# Patient Record
Sex: Female | Born: 1965 | Race: White | Hispanic: No | Marital: Married | Smoking: Never smoker
Health system: Southern US, Community
[De-identification: ages and names within clinical notes are randomized; demographics above are authoritative.]

## PROBLEM LIST (undated history)

## (undated) DIAGNOSIS — I10 Essential (primary) hypertension: Secondary | ICD-10-CM

## (undated) DIAGNOSIS — I469 Cardiac arrest, cause unspecified: Secondary | ICD-10-CM

## (undated) DIAGNOSIS — I2510 Atherosclerotic heart disease of native coronary artery without angina pectoris: Secondary | ICD-10-CM

## (undated) HISTORY — PX: ABDOMINAL HYSTERECTOMY: SHX81

---

## 2005-05-23 ENCOUNTER — Emergency Department: Payer: Self-pay | Admitting: Emergency Medicine

## 2008-08-27 ENCOUNTER — Inpatient Hospital Stay: Payer: Self-pay | Admitting: Obstetrics and Gynecology

## 2008-08-30 ENCOUNTER — Ambulatory Visit: Payer: Self-pay | Admitting: Gynecologic Oncology

## 2008-09-02 ENCOUNTER — Ambulatory Visit: Payer: Self-pay | Admitting: Gynecologic Oncology

## 2008-09-06 ENCOUNTER — Inpatient Hospital Stay: Payer: Self-pay | Admitting: Obstetrics and Gynecology

## 2008-09-21 ENCOUNTER — Ambulatory Visit: Payer: Self-pay | Admitting: Gynecologic Oncology

## 2010-01-31 ENCOUNTER — Ambulatory Visit: Payer: Self-pay | Admitting: Obstetrics and Gynecology

## 2010-02-13 ENCOUNTER — Ambulatory Visit: Payer: Self-pay | Admitting: Obstetrics and Gynecology

## 2011-01-24 ENCOUNTER — Ambulatory Visit: Payer: Self-pay | Admitting: Obstetrics and Gynecology

## 2012-09-04 ENCOUNTER — Ambulatory Visit: Payer: Self-pay | Admitting: Family Medicine

## 2013-10-01 DIAGNOSIS — I10 Essential (primary) hypertension: Secondary | ICD-10-CM | POA: Insufficient documentation

## 2013-10-01 DIAGNOSIS — E785 Hyperlipidemia, unspecified: Secondary | ICD-10-CM | POA: Insufficient documentation

## 2014-01-21 DIAGNOSIS — I469 Cardiac arrest, cause unspecified: Secondary | ICD-10-CM

## 2014-01-21 HISTORY — DX: Cardiac arrest, cause unspecified: I46.9

## 2014-04-03 DIAGNOSIS — E042 Nontoxic multinodular goiter: Secondary | ICD-10-CM | POA: Insufficient documentation

## 2014-11-19 DIAGNOSIS — I469 Cardiac arrest, cause unspecified: Secondary | ICD-10-CM | POA: Insufficient documentation

## 2014-11-29 DIAGNOSIS — I219 Acute myocardial infarction, unspecified: Secondary | ICD-10-CM | POA: Insufficient documentation

## 2014-11-29 DIAGNOSIS — R479 Unspecified speech disturbances: Secondary | ICD-10-CM | POA: Insufficient documentation

## 2014-11-29 DIAGNOSIS — R7989 Other specified abnormal findings of blood chemistry: Secondary | ICD-10-CM | POA: Insufficient documentation

## 2014-11-29 DIAGNOSIS — D696 Thrombocytopenia, unspecified: Secondary | ICD-10-CM | POA: Insufficient documentation

## 2014-11-29 DIAGNOSIS — D649 Anemia, unspecified: Secondary | ICD-10-CM | POA: Insufficient documentation

## 2014-12-14 DIAGNOSIS — I2510 Atherosclerotic heart disease of native coronary artery without angina pectoris: Secondary | ICD-10-CM | POA: Insufficient documentation

## 2014-12-18 DIAGNOSIS — G931 Anoxic brain damage, not elsewhere classified: Secondary | ICD-10-CM | POA: Insufficient documentation

## 2014-12-28 ENCOUNTER — Ambulatory Visit: Payer: BC Managed Care – PPO | Admitting: Speech Pathology

## 2015-02-08 ENCOUNTER — Encounter: Payer: Self-pay | Admitting: *Deleted

## 2015-02-08 ENCOUNTER — Encounter: Payer: BC Managed Care – PPO | Attending: Internal Medicine | Admitting: *Deleted

## 2015-02-08 VITALS — Ht 64.5 in | Wt 163.9 lb

## 2015-02-08 DIAGNOSIS — I214 Non-ST elevation (NSTEMI) myocardial infarction: Secondary | ICD-10-CM | POA: Diagnosis present

## 2015-02-08 DIAGNOSIS — Z9861 Coronary angioplasty status: Secondary | ICD-10-CM | POA: Insufficient documentation

## 2015-02-08 NOTE — Patient Instructions (Signed)
Patient Instructions  Patient Details  Name: Sarah Bowen MRN: [id number] Date of Birth: 1965/06/28 Referring Provider:  Raynelle Jan, MD  Below are the personal goals you chose as well as exercise and nutrition goals. Our goal is to help you keep on track towards obtaining and maintaining your goals. We will be discussing your progress on these goals with you throughout the program.  Initial Exercise Prescription:     Initial Exercise Prescription - 02/08/15 1400    Date of Initial Exercise Prescription   Date 02/08/15   Treadmill   MPH 2   Grade 0   Minutes 15   Bike   Level 0.4   Watts 12   Minutes 10   Recumbant Bike   Level 3   RPM 40   Watts 25   Minutes 15   NuStep   Level 3   Watts 30   Minutes 15   Arm Ergometer   Level 1   Watts 8   Minutes 10   Arm/Foot Ergometer   Level 4   Watts 15   Minutes 10   Cybex   Level 3   RPM 50   Minutes 10   Recumbant Elliptical   Level 1   RPM 40   Watts 10   Minutes 10   Elliptical   Level 1   Speed 3   Minutes 1   REL-XR   Level 3   Watts 40   Minutes 15   T5 Nustep   Level 2   Watts 20   Minutes 15   Biostep-RELP   Level 3   Watts 25   Minutes 15   Prescription Details   Frequency (times per week) 3   Duration Progress to 30 minutes of continuous aerobic without signs/symptoms of physical distress   Intensity   THRR REST +  30   Ratings of Perceived Exertion 11-15   Progression Continue progressive overload as per policy without signs/symptoms or physical distress.   Resistance Training   Training Prescription Yes   Weight 2   Reps 10-15      Exercise Goals: Frequency: Be able to perform aerobic exercise three times per week working toward 3-5 days per week.  Intensity: Work with a perceived exertion of 11 (fairly light) - 15 (hard) as tolerated. Follow your new exercise prescription and watch for changes in prescription as you progress with the program. Changes will be reviewed with you  when they are made.  Duration: You should be able to do 30 minutes of continuous aerobic exercise in addition to a 5 minute warm-up and a 5 minute cool-down routine.  Nutrition Goals: Your personal nutrition goals will be established when you do your nutrition analysis with the dietician.  The following are nutrition guidelines to follow: Cholesterol < /day Sodium < /day Fiber: Women under 50 yrs - 25 grams per day  Personal Goals:     Personal Goals and Risk Factors at Admission - 02/08/15 1527    Personal Goals and Risk Factors on Admission   Increase Aerobic Exercise and Physical Activity Yes   Intervention While in program, learn and follow the exercise prescription taught. Start at a low level workload and increase workload after able to maintain previous level for 30 minutes. Increase time before increasing intensity.   Take Less Medication Yes   Intervention Learn your risk factors and begin the lifestyle modifications for risk factor control during your time in the program.   Understand  more about Heart/Pulmonary Disease. Yes   Intervention While in program utilize professionals for any questions, and attend the education sessions. Great websites to use are www.americanheart.org or www.lung.org for reliable information.   Hypertension Yes   Goal Participant will see blood pressure controlled within the values of 140/30mm/Hg or within value directed by their physician.   Intervention Provide nutrition & aerobic exercise along with prescribed medications to achieve BP 140/90 or less.   Lipids Yes   Goal Cholesterol controlled with medications as prescribed, with individualized exercise RX and with personalized nutrition plan. Value goals: LDL < 70mg , HDL > 40mg . Participant states understanding of desired cholesterol values and following prescriptions.   Intervention Provide nutrition & aerobic exercise along with prescribed medications to achieve LDL 70mg , HDL >40mg .    Stress Yes   Goal To meet with psychosocial counselor for stress and relaxation information and guidance. To state understanding of performing relaxation techniques and or identifying personal stressors.   Intervention Provide education on types of stress, identifiying stressors, and ways to cope with stress. Provide demonstration and active practice of relaxation techniques.   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  Due to her cardiac arrest, NSTEMI, and PCI patient has not been able return to work.  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  She is seeing Human resources officer.  Patient would like to return to work.     Intervention Other  Participate in Cardiac Rehab to regain strength, endurance, and overall physical and mental health.        Tobacco Use Initial Evaluation: History  Smoking status  . Never Smoker   Smokeless tobacco  . Never Used    Copy of goals given to participant.

## 2015-02-08 NOTE — Progress Notes (Signed)
Cardiac Individual Treatment Plan  Patient Details  Name: Sarah Bowen MRN: [id number] Date of Birth: 1965-12-09 Referring Provider:  Raynelle Jan, MD  Initial Encounter Date: Date: 02/08/15  Visit Diagnosis: NSTEMI (non-ST elevated myocardial infarction) (HCC)  S/P PTCA (percutaneous transluminal coronary angioplasty)  Patient's Home Medications on Admission:  Current outpatient prescriptions:  .  amLODipine (NORVASC) 5 MG tablet, Take 5 mg by mouth every morning., Disp: , Rfl:  .  aspirin EC 81 MG tablet, Take 81 mg by mouth every morning., Disp: , Rfl:  .  atorvastatin (LIPITOR) 80 MG tablet, Take 80 mg by mouth at bedtime., Disp: , Rfl:  .  lisinopril (PRINIVIL,ZESTRIL) 20 MG tablet, Take 20 mg by mouth daily., Disp: , Rfl:  .  magnesium oxide (MAG-OX) 400 MG tablet, Take 400 mg by mouth daily., Disp: , Rfl:  .  metoprolol succinate (TOPROL-XL) 25 MG 24 hr tablet, Take 25 mg by mouth daily., Disp: , Rfl:  .  nitroGLYCERIN (NITROSTAT) 0.4 MG SL tablet, Place 0.4 mg under the tongue every 5 (five) minutes x 3 doses as needed. For Chest Pain, Disp: , Rfl:  .  ticagrelor (BRILINTA) 90 MG TABS tablet, Take 90 mg by mouth every 12 (twelve) hours., Disp: , Rfl:  .  EPINEPHrine 0.3 mg/0.3 mL IJ SOAJ injection, Inject 0.3 mg into the muscle Once PRN., Disp: , Rfl:   Past Medical History: History reviewed. No pertinent past medical history.  Tobacco Use: History  Smoking status  . Never Smoker   Smokeless tobacco  . Never Used    Labs: Recent Review Flowsheet Data    There is no flowsheet data to display.       Exercise Target Goals: Date: 02/08/15  Exercise Program Goal: Individual exercise prescription set with THRR, safety & activity barriers. Participant demonstrates ability to understand and report RPE using BORG scale, to self-measure pulse accurately, and to acknowledge the importance of the exercise prescription.  Exercise Prescription Goal: Starting with  aerobic activity 30 plus minutes a day, 3 days per week for initial exercise prescription. Provide home exercise prescription and guidelines that participant acknowledges understanding prior to discharge.  Activity Barriers & Risk Stratification:     Activity Barriers & Risk Stratification - 02/08/15 1522    Activity Barriers & Risk Stratification   Activity Barriers None   Risk Stratification High      6 Minute Walk:     6 Minute Walk      02/08/15 1438       6 Minute Walk   Phase Initial     Distance 1135 feet     Walk Time 6 minutes     Resting HR 68 bpm     Resting BP 110/70 mmHg     Max Ex. HR 117 bpm     Max Ex. BP 120/64 mmHg     RPE 7     Symptoms No        Initial Exercise Prescription:     Initial Exercise Prescription - 02/08/15 1400    Date of Initial Exercise Prescription   Date 02/08/15   Treadmill   MPH 2   Grade 0   Minutes 15   Bike   Level 0.4   Watts 12   Minutes 10   Recumbant Bike   Level 3   RPM 40   Watts 25   Minutes 15   NuStep   Level 3   Watts 30   Minutes 15  Arm Ergometer   Level 1   Watts 8   Minutes 10   Arm/Foot Ergometer   Level 4   Watts 15   Minutes 10   Cybex   Level 3   RPM 50   Minutes 10   Recumbant Elliptical   Level 1   RPM 40   Watts 10   Minutes 10   Elliptical   Level 1   Speed 3   Minutes 1   REL-XR   Level 3   Watts 40   Minutes 15   T5 Nustep   Level 2   Watts 20   Minutes 15   Biostep-RELP   Level 3   Watts 25   Minutes 15   Prescription Details   Frequency (times per week) 3   Duration Progress to 30 minutes of continuous aerobic without signs/symptoms of physical distress   Intensity   THRR REST +  30   Ratings of Perceived Exertion 11-15   Progression Continue progressive overload as per policy without signs/symptoms or physical distress.   Resistance Training   Training Prescription Yes   Weight 2   Reps 10-15      Exercise Prescription Changes:   Discharge  Exercise Prescription (Final Exercise Prescription Changes):   Nutrition:  Target Goals: Understanding of nutrition guidelines, daily intake of sodium 1500mg , cholesterol 200mg , calories 30% from fat and 7% or less from saturated fats, daily to have 5 or more servings of fruits and vegetables.  Biometrics:     Pre Biometrics - 02/08/15 1438    Pre Biometrics   Height 5' 4.5" (1.638 m)   Weight 163 lb 14.4 oz (74.345 kg)   Waist Circumference 34 inches   Hip Circumference 40.5 inches   Waist to Hip Ratio 0.84 %   BMI (Calculated) 27.8       Nutrition Therapy Plan and Nutrition Goals:   Nutrition Discharge: Rate Your Plate Scores:   Nutrition Goals Re-Evaluation:   Psychosocial: Target Goals: Acknowledge presence or absence of depression, maximize coping skills, provide positive support system. Participant is able to verbalize types and ability to use techniques and skills needed for reducing stress and depression.  Initial Review & Psychosocial Screening:     Initial Psych Review & Screening - 02/08/15 1731    Family Dynamics   Comments Patient has a 62 year old daughter and a 12 year old son.  Her sister, Sarah Bowen, who works at [employer], is also supportive and is involved.  Patient scored a 10 on PHQ-9 due in part to problems associated with anoxic brain injury.  Pt is slow to respond and process information.  Pt. is undergoing speech therapy.     Screening Interventions   Interventions Encouraged to exercise;Program counselor consult      Quality of Life Scores:     Quality of Life - 02/08/15 1738    Quality of Life Scores   Health/Function Pre 13.96 %   Socioeconomic Pre 18.69 %   Psych/Spiritual Pre 28.29 %   Family Pre 25.5 %   GLOBAL Pre 19.72 %      PHQ-9:     Recent Review Flowsheet Data    Depression screen [facility] 2/9 02/08/2015   Decreased Interest 0   Down, Depressed, Hopeless 0   PHQ - 2 Score 0   Altered sleeping 0   Tired,  decreased energy 3   Change in appetite 3   Feeling bad or failure about yourself  0   Trouble concentrating 1   Moving slowly or fidgety/restless 3   Suicidal thoughts 0   PHQ-9 Score 10   Difficult doing work/chores Somewhat difficult      Psychosocial Evaluation and Intervention:   Psychosocial Re-Evaluation:   Vocational Rehabilitation: Provide vocational rehab assistance to qualifying candidates.   Vocational Rehab Evaluation & Intervention:     Vocational Rehab - 02/08/15 1523    Initial Vocational Rehab Evaluation & Intervention   Assessment shows need for Vocational Rehabilitation No      Education: Education Goals: Education classes will be provided on a weekly basis, covering required topics. Participant will state understanding/return demonstration of topics presented.  Learning Barriers/Preferences:     Learning Barriers/Preferences - 02/08/15 1523    Learning Barriers/Preferences   Learning Barriers None   Learning Preferences Written Material      Education Topics: General Nutrition Guidelines/Fats and Fiber: -Group instruction provided by verbal, written material, models and posters to present the general guidelines for heart healthy nutrition. Gives an explanation and review of dietary fats and fiber.   Controlling Sodium/Reading Food Labels: -Group verbal and written material supporting the discussion of sodium use in heart healthy nutrition. Review and explanation with models, verbal and written materials for utilization of the food label.   Exercise Physiology & Risk Factors: - Group verbal and written instruction with models to review the exercise physiology of the cardiovascular system and associated critical values. Details cardiovascular disease risk factors and the goals associated with each risk factor.   Aerobic Exercise & Resistance Training: - Gives group verbal and written discussion on the health impact of inactivity. On the  components of aerobic and resistive training programs and the benefits of this training and how to safely progress through these programs.   Flexibility, Balance, General Exercise Guidelines: - Provides group verbal and written instruction on the benefits of flexibility and balance training programs. Provides general exercise guidelines with specific guidelines to those with heart or lung disease. Demonstration and skill practice provided.   Stress Management: - Provides group verbal and written instruction about the health risks of elevated stress, cause of high stress, and healthy ways to reduce stress.   Depression: - Provides group verbal and written instruction on the correlation between heart/lung disease and depressed mood, treatment options, and the stigmas associated with seeking treatment.   Anatomy & Physiology of the Heart: - Group verbal and written instruction and models provide basic cardiac anatomy and physiology, with the coronary electrical and arterial systems. Review of: AMI, Angina, Valve disease, Heart Failure, Cardiac Arrhythmia, Pacemakers, and the ICD.   Cardiac Procedures: - Group verbal and written instruction and models to describe the testing methods done to diagnose heart disease. Reviews the outcomes of the test results. Describes the treatment choices: Medical Management, Angioplasty, or Coronary Bypass Surgery.   Cardiac Medications: - Group verbal and written instruction to review commonly prescribed medications for heart disease. Reviews the medication, class of the drug, and side effects. Includes the steps to properly store meds and maintain the prescription regimen.   Go Sex-Intimacy & Heart Disease, Get SMART - Goal Setting: - Group verbal and written instruction through game format to discuss heart disease and the return to sexual intimacy. Provides group verbal and written material to discuss and apply goal setting through the application of the  S.M.A.R.T. Method.   Other Matters of the Heart: - Provides group verbal, written materials and models to describe Heart Failure, Angina, Valve Disease,  and Diabetes in the realm of heart disease. Includes description of the disease process and treatment options available to the cardiac patient.   Exercise & Equipment Safety: - Individual verbal instruction and demonstration of equipment use and safety with use of the equipment.          Cardiac Rehab from 02/08/2015 in [facility] Cardiac and Pulmonary Rehab   Date  02/08/15   Educator  D. Delford Field, RN   Instruction Review Code  1- partially meets, needs review/practice      Infection Prevention: - Provides verbal and written material to individual with discussion of infection control including proper hand washing and proper equipment cleaning during exercise session.      Cardiac Rehab from 02/08/2015 in [facility] Cardiac and Pulmonary Rehab   Date  02/08/15   Educator  D. Delford Field, RN   Instruction Review Code  2- meets goals/outcomes      Falls Prevention: - Provides verbal and written material to individual with discussion of falls prevention and safety.      Cardiac Rehab from 02/08/2015 in [facility] Cardiac and Pulmonary Rehab   Date  02/08/15   Educator  D. Delford Field, RN   Instruction Review Code  2- meets goals/outcomes      Diabetes: - Individual verbal and written instruction to review signs/symptoms of diabetes, desired ranges of glucose level fasting, after meals and with exercise. Advice that pre and post exercise glucose checks will be done for 3 sessions at entry of program.    Knowledge Questionnaire Score:     Knowledge Questionnaire Score - 02/08/15 1523    Knowledge Questionnaire Score   Pre Score 22/28      Personal Goals and Risk Factors at Admission:     Personal Goals and Risk Factors at Admission - 02/08/15 1527    Personal Goals and Risk Factors on Admission   Increase Aerobic Exercise and Physical Activity Yes    Intervention While in program, learn and follow the exercise prescription taught. Start at a low level workload and increase workload after able to maintain previous level for 30 minutes. Increase time before increasing intensity.   Take Less Medication Yes   Intervention Learn your risk factors and begin the lifestyle modifications for risk factor control during your time in the program.   Understand more about Heart/Pulmonary Disease. Yes   Intervention While in program utilize professionals for any questions, and attend the education sessions. Great websites to use are www.americanheart.org or www.lung.org for reliable information.   Hypertension Yes   Goal Participant will see blood pressure controlled within the values of 140/24mm/Hg or within value directed by their physician.   Intervention Provide nutrition & aerobic exercise along with prescribed medications to achieve BP 140/90 or less.   Lipids Yes   Goal Cholesterol controlled with medications as prescribed, with individualized exercise RX and with personalized nutrition plan. Value goals: LDL < 70mg , HDL > 40mg . Participant states understanding of desired cholesterol values and following prescriptions.   Intervention Provide nutrition & aerobic exercise along with prescribed medications to achieve LDL 70mg , HDL >40mg .   Stress Yes   Goal To meet with psychosocial counselor for stress and relaxation information and guidance. To state understanding of performing relaxation techniques and or identifying personal stressors.   Intervention Provide education on types of stress, identifiying stressors, and ways to cope with stress. Provide demonstration and active practice of relaxation techniques.   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  Due to her cardiac arrest, NSTEMI, and PCI patient has not been able return to work.  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  She is seeing Human resources officer.  Patient would like to return to work.     Intervention Other  Participate in Cardiac Rehab to regain strength, endurance, and overall physical and mental health.        Personal Goals and Risk Factors Review:    Personal Goals Discharge (Final Personal Goals and Risk Factors Review):    ITP Comments:   Comments: Patient is s/p cardiac arrest (V-Fib Arrest), NSTEMI and PCI.  Patient wears a Technical sales engineer and will see the Electrophysiologist on February 8th, 2017.   Patient suffered anoxic brain injury as a result of cardiac arrest.  Her deficits include slow to process thoughts and slow to find the words to respond.  Her daughter, Sarah Bowen, and her sister, Sarah Bowen, accompanied her today.  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  Sarah Bowen will start Cardiac Rehab on Monday, January 23rd, 2017.

## 2015-02-13 DIAGNOSIS — I214 Non-ST elevation (NSTEMI) myocardial infarction: Secondary | ICD-10-CM

## 2015-02-13 DIAGNOSIS — Z9861 Coronary angioplasty status: Secondary | ICD-10-CM

## 2015-02-13 NOTE — Progress Notes (Signed)
Daily Session Note  Patient Details  Name: ELODY KLEINSASSER MRN: [id number] Date of Birth: October 02, 1965 Referring Provider:  Concha Pyo, MD  Encounter Date: 02/13/2015  Check In:     Session Check In - 02/13/15 1619    Check-In   Staff Present Heath Lark, RN, BSN, CCRP;Carroll Enterkin, RN, BSN;Antinette Keough, BS, ACSM EP-C, Exercise Physiologist   ER physicians immediately available to respond to emergencies See telemetry face sheet for immediately available ER MD   Medication changes reported     No   Fall or balance concerns reported    No   Warm-up and Cool-down Performed on first and last piece of equipment   VAD Patient? No   Pain Assessment   Currently in Pain? No/denies         Goals Met:  Proper associated with RPD/PD & O2 Sat Exercise tolerated well No report of cardiac concerns or symptoms Strength training completed today  Goals Unmet:  Not Applicable  Goals Comments:   Dr. Emily Filbert is Medical Director for [place] [facility].

## 2015-02-15 ENCOUNTER — Encounter: Payer: BC Managed Care – PPO | Admitting: *Deleted

## 2015-02-15 DIAGNOSIS — I214 Non-ST elevation (NSTEMI) myocardial infarction: Secondary | ICD-10-CM | POA: Diagnosis not present

## 2015-02-15 DIAGNOSIS — Z9861 Coronary angioplasty status: Secondary | ICD-10-CM

## 2015-02-15 NOTE — Progress Notes (Signed)
Daily Session Note  Patient Details  Name: Sarah Bowen MRN: [id number] Date of Birth: May 10, 1965 Referring Provider:  Concha Pyo, MD  Encounter Date: 02/15/2015  Check In:     Session Check In - 02/15/15 1604    Check-In   Staff Present Heath Lark, RN, BSN, CCRP;Diane Joya Gaskins, RN, BSN;Carroll Enterkin, RN, BSN   ER physicians immediately available to respond to emergencies See telemetry face sheet for immediately available ER MD   Medication changes reported     No   Fall or balance concerns reported    No   Warm-up and Cool-down Performed on first and last piece of equipment   VAD Patient? No   Pain Assessment   Currently in Pain? No/denies           Exercise Prescription Changes - 02/15/15 1600    NuStep   Level 3   Watts 30   Minutes 20      Goals Met:  Exercise tolerated well Personal goals reviewed No report of cardiac concerns or symptoms Strength training completed today  Goals Unmet:  Not Applicable  Goals Comments: Increased exercise prescription on NuStep to Level 3 Watts 30 for 20 minutes.  Patient completed exercise prescription and all exercise goals during rehab session. The exercise was tolerated well and the patient is progressing in the program.    Dr. Emily Filbert is Medical Director for [place] [facility].

## 2015-02-16 DIAGNOSIS — I214 Non-ST elevation (NSTEMI) myocardial infarction: Secondary | ICD-10-CM | POA: Diagnosis not present

## 2015-02-16 DIAGNOSIS — Z9861 Coronary angioplasty status: Secondary | ICD-10-CM

## 2015-02-16 NOTE — Progress Notes (Signed)
Cardiac Individual Treatment Plan  Patient Details  Name: Sarah Bowen MRN: [id number] Date of Birth: 1965/07/10 Referring Provider:  Concha Pyo, MD  Initial Encounter Date:    Visit Diagnosis: NSTEMI (non-ST elevated myocardial infarction) ([place])  S/P PTCA (percutaneous transluminal coronary angioplasty)  Patient's Home Medications on Admission:  Current outpatient prescriptions:  .  amLODipine (NORVASC) 5 MG tablet, Take 5 mg by mouth every morning., Disp: , Rfl:  .  aspirin EC 81 MG tablet, Take 81 mg by mouth every morning., Disp: , Rfl:  .  atorvastatin (LIPITOR) 80 MG tablet, Take 80 mg by mouth at bedtime., Disp: , Rfl:  .  EPINEPHrine 0.3 mg/0.3 mL IJ SOAJ injection, Inject 0.3 mg into the muscle Once PRN., Disp: , Rfl:  .  lisinopril (PRINIVIL,ZESTRIL) 20 MG tablet, Take 20 mg by mouth daily., Disp: , Rfl:  .  magnesium oxide (MAG-OX) 400 MG tablet, Take 400 mg by mouth daily., Disp: , Rfl:  .  metoprolol succinate (TOPROL-XL) 25 MG 24 hr tablet, Take 25 mg by mouth daily., Disp: , Rfl:  .  nitroGLYCERIN (NITROSTAT) 0.4 MG SL tablet, Place 0.4 mg under the tongue every 5 (five) minutes x 3 doses as needed. For Chest Pain, Disp: , Rfl:  .  ticagrelor (BRILINTA) 90 MG TABS tablet, Take 90 mg by mouth every 12 (twelve) hours., Disp: , Rfl:   Past Medical History: No past medical history on file.  Tobacco Use: History  Smoking status  . Never Smoker   Smokeless tobacco  . Never Used    Labs: Recent Review Flowsheet Data    There is no flowsheet data to display.       Exercise Target Goals:    Exercise Program Goal: Individual exercise prescription set with THRR, safety & activity barriers. Participant demonstrates ability to understand and report RPE using BORG scale, to self-measure pulse accurately, and to acknowledge the importance of the exercise prescription.  Exercise Prescription Goal: Starting with aerobic activity 30 plus minutes a day, 3 days per  week for initial exercise prescription. Provide home exercise prescription and guidelines that participant acknowledges understanding prior to discharge.  Activity Barriers & Risk Stratification:     Activity Barriers & Risk Stratification - 02/08/15 1522    Activity Barriers & Risk Stratification   Activity Barriers None   Risk Stratification High      6 Minute Walk:     6 Minute Walk      02/08/15 1438       6 Minute Walk   Phase Initial     Distance 1135 feet     Walk Time 6 minutes     Resting HR 68 bpm     Resting BP 110/70 mmHg     Max Ex. HR 117 bpm     Max Ex. BP 120/64 mmHg     RPE 7     Symptoms No        Initial Exercise Prescription:     Initial Exercise Prescription - 02/08/15 1400    Date of Initial Exercise Prescription   Date 02/08/15   Treadmill   MPH 2   Grade 0   Minutes 15   Bike   Level 0.4   Watts 12   Minutes 10   Recumbant Bike   Level 3   RPM 40   Watts 25   Minutes 15   NuStep   Level 3   Watts 30   Minutes 15  Arm Ergometer   Level 1   Watts 8   Minutes 10   Arm/Foot Ergometer   Level 4   Watts 15   Minutes 10   Cybex   Level 3   RPM 50   Minutes 10   Recumbant Elliptical   Level 1   RPM 40   Watts 10   Minutes 10   Elliptical   Level 1   Speed 3   Minutes 1   REL-XR   Level 3   Watts 40   Minutes 15   T5 Nustep   Level 2   Watts 20   Minutes 15   Biostep-RELP   Level 3   Watts 25   Minutes 15   Prescription Details   Frequency (times per week) 3   Duration Progress to 30 minutes of continuous aerobic without signs/symptoms of physical distress   Intensity   THRR REST +  30   Ratings of Perceived Exertion 11-15   Progression Continue progressive overload as per policy without signs/symptoms or physical distress.   Resistance Training   Training Prescription Yes   Weight 2   Reps 10-15      Exercise Prescription Changes:     Exercise Prescription Changes      02/15/15 1600            NuStep   Level 3       Watts 30       Minutes 20          Discharge Exercise Prescription (Final Exercise Prescription Changes):     Exercise Prescription Changes - 02/15/15 1600    NuStep   Level 3   Watts 30   Minutes 20      Nutrition:  Target Goals: Understanding of nutrition guidelines, daily intake of sodium '1500mg'$ , cholesterol '200mg'$ , calories 30% from fat and 7% or less from saturated fats, daily to have 5 or more servings of fruits and vegetables.  Biometrics:     Pre Biometrics - 02/08/15 1438    Pre Biometrics   Height 5' 4.5" (1.638 m)   Weight 163 lb 14.4 oz (74.345 kg)   Waist Circumference 34 inches   Hip Circumference 40.5 inches   Waist to Hip Ratio 0.84 %   BMI (Calculated) 27.8       Nutrition Therapy Plan and Nutrition Goals:   Nutrition Discharge: Rate Your Plate Scores:   Nutrition Goals Re-Evaluation:   Psychosocial: Target Goals: Acknowledge presence or absence of depression, maximize coping skills, provide positive support system. Participant is able to verbalize types and ability to use techniques and skills needed for reducing stress and depression.  Initial Review & Psychosocial Screening:     Initial Psych Review & Screening - 02/08/15 1731    Family Dynamics   Comments Patient has a 42 year old daughter and a 22 year old son.  Her sister, Dereck Ligas, who works at [employer], is also supportive and is involved.  Patient scored a 10 on PHQ-9 due in part to problems associated with anoxic brain injury.  Pt is slow to respond and process information.  Pt. is undergoing speech therapy.     Screening Interventions   Interventions Encouraged to exercise;Program counselor consult      Quality of Life Scores:     Quality of Life - 02/08/15 1738    Quality of Life Scores   Health/Function Pre 13.96 %   Socioeconomic Pre 18.69 %   Psych/Spiritual Pre  28.29 %   Family Pre 25.5 %   GLOBAL Pre 19.72 %      PHQ-9:      Recent Review Flowsheet Data    Depression screen [facility] 2/9 02/08/2015   Decreased Interest 0   Down, Depressed, Hopeless 0   PHQ - 2 Score 0   Altered sleeping 0   Tired, decreased energy 3   Change in appetite 3   Feeling bad or failure about yourself  0   Trouble concentrating 1   Moving slowly or fidgety/restless 3   Suicidal thoughts 0   PHQ-9 Score 10   Difficult doing work/chores Somewhat difficult      Psychosocial Evaluation and Intervention:     Psychosocial Evaluation - 02/13/15 1739    Psychosocial Evaluation & Interventions   Interventions Encouraged to exercise with the program and follow exercise prescription   Comments Counselor met with Ms. Armbrister today for initial psychosocial evaluation.  She is a 50 year old who went into cardiac arrest in October and was out of oxygen for quite some time, causing some cognitive problems currently.  She has a strong support system with older children living in her home and a sister close by.  She also is actively involved in her local church community.  Ms. Neiswonger states she sleeps well and although a "picky eater", her appetite is generally okay.  She denies a history of depression or anxiety or current symptoms.  She also states her mood is typically positive.  Ms. 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  She also is unable to drive currently.  Her goals for this program are to increase her stamina and strength to be able to go up and down her stairs in her two story home with greater ease.  She plans to look into local resources, or a gym for maintenance upon completion of this program.        Psychosocial Re-Evaluation:   Vocational Rehabilitation: Provide vocational rehab assistance to qualifying candidates.   Vocational Rehab Evaluation & Intervention:     Vocational Rehab - 02/08/15 1523     Initial Vocational Rehab Evaluation & Intervention   Assessment shows need for Vocational Rehabilitation No      Education: Education Goals: Education classes will be provided on a weekly basis, covering required topics. Participant will state understanding/return demonstration of topics presented.  Learning Barriers/Preferences:     Learning Barriers/Preferences - 02/08/15 1523    Learning Barriers/Preferences   Learning Barriers None   Learning Preferences Written Material      Education Topics: General Nutrition Guidelines/Fats and Fiber: -Group instruction provided by verbal, written material, models and posters to present the general guidelines for heart healthy nutrition. Gives an explanation and review of dietary fats and fiber.   Controlling Sodium/Reading Food Labels: -Group verbal and written material supporting the discussion of sodium use in heart healthy nutrition. Review and explanation with models, verbal and written materials for utilization of the food label.   Exercise Physiology & Risk Factors: - Group verbal and written instruction with models to review the exercise physiology of the cardiovascular system and associated critical values. Details cardiovascular disease risk factors and the goals associated with each risk factor.   Aerobic Exercise & Resistance Training: - Gives group verbal and written discussion on the health impact of inactivity. On the components of  aerobic and resistive training programs and the benefits of this training and how to safely progress through these programs.   Flexibility, Balance, General Exercise Guidelines: - Provides group verbal and written instruction on the benefits of flexibility and balance training programs. Provides general exercise guidelines with specific guidelines to those with heart or lung disease. Demonstration and skill practice provided.   Stress Management: - Provides group verbal and written instruction  about the health risks of elevated stress, cause of high stress, and healthy ways to reduce stress.   Depression: - Provides group verbal and written instruction on the correlation between heart/lung disease and depressed mood, treatment options, and the stigmas associated with seeking treatment.   Anatomy & Physiology of the Heart: - Group verbal and written instruction and models provide basic cardiac anatomy and physiology, with the coronary electrical and arterial systems. Review of: AMI, Angina, Valve disease, Heart Failure, Cardiac Arrhythmia, Pacemakers, and the ICD.          Cardiac Rehab from 02/15/2015 in [facility] Cardiac and Pulmonary Rehab   Date  02/13/15   Educator  SB   Instruction Review Code  2- meets goals/outcomes      Cardiac Procedures: - Group verbal and written instruction and models to describe the testing methods done to diagnose heart disease. Reviews the outcomes of the test results. Describes the treatment choices: Medical Management, Angioplasty, or Coronary Bypass Surgery.   Cardiac Medications: - Group verbal and written instruction to review commonly prescribed medications for heart disease. Reviews the medication, class of the drug, and side effects. Includes the steps to properly store meds and maintain the prescription regimen.   Go Sex-Intimacy & Heart Disease, Get SMART - Goal Setting: - Group verbal and written instruction through game format to discuss heart disease and the return to sexual intimacy. Provides group verbal and written material to discuss and apply goal setting through the application of the S.M.A.R.T. Method.   Other Matters of the Heart: - Provides group verbal, written materials and models to describe Heart Failure, Angina, Valve Disease, and Diabetes in the realm of heart disease. Includes description of the disease process and treatment options available to the cardiac patient.   Exercise & Equipment Safety: - Individual verbal  instruction and demonstration of equipment use and safety with use of the equipment.      Cardiac Rehab from 02/15/2015 in [facility] Cardiac and Pulmonary Rehab   Date  02/08/15   Educator  D. Joya Gaskins, RN   Instruction Review Code  1- partially meets, needs review/practice      Infection Prevention: - Provides verbal and written material to individual with discussion of infection control including proper hand washing and proper equipment cleaning during exercise session.      Cardiac Rehab from 02/15/2015 in [facility] Cardiac and Pulmonary Rehab   Date  02/08/15   Educator  D. Joya Gaskins, RN   Instruction Review Code  2- meets goals/outcomes      Falls Prevention: - Provides verbal and written material to individual with discussion of falls prevention and safety.      Cardiac Rehab from 02/15/2015 in [facility] Cardiac and Pulmonary Rehab   Date  02/08/15   Educator  D. Joya Gaskins, RN   Instruction Review Code  2- meets goals/outcomes      Diabetes: - Individual verbal and written instruction to review signs/symptoms of diabetes, desired ranges of glucose level fasting, after meals and with exercise. Advice that pre and post exercise glucose checks will be  done for 3 sessions at entry of program.    Knowledge Questionnaire Score:     Knowledge Questionnaire Score - 02/08/15 1523    Knowledge Questionnaire Score   Pre Score 22/28      Personal Goals and Risk Factors at Admission:     Personal Goals and Risk Factors at Admission - 02/08/15 1527    Personal Goals and Risk Factors on Admission   Increase Aerobic Exercise and Physical Activity Yes   Intervention While in program, learn and follow the exercise prescription taught. Start at a low level workload and increase workload after able to maintain previous level for 30 minutes. Increase time before increasing intensity.   Take Less Medication Yes   Intervention Learn your risk factors and begin the lifestyle modifications for risk factor control  during your time in the program.   Understand more about Heart/Pulmonary Disease. Yes   Intervention While in program utilize professionals for any questions, and attend the education sessions. Great websites to use are www.americanheart.org or www.lung.org for reliable information.   Hypertension Yes   Goal Participant will see blood pressure controlled within the values of 140/37m/Hg or within value directed by their physician.   Intervention Provide nutrition & aerobic exercise along with prescribed medications to achieve BP 140/90 or less.   Lipids Yes   Goal Cholesterol controlled with medications as prescribed, with individualized exercise RX and with personalized nutrition plan. Value goals: LDL < '70mg'$ , HDL > '40mg'$ . Participant states understanding of desired cholesterol values and following prescriptions.   Intervention Provide nutrition & aerobic exercise along with prescribed medications to achieve LDL '70mg'$ , HDL >'40mg'$ .   Stress Yes   Goal To meet with psychosocial counselor for stress and relaxation information and guidance. To state understanding of performing relaxation techniques and or identifying personal stressors.   Intervention Provide education on types of stress, identifiying stressors, and ways to cope with stress. Provide demonstration and active practice of relaxation techniques.   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  Due to her cardiac arrest, NSTEMI, and PCI patient has not been able return to work.  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  She is seeing SAstronomer  Patient would like to return to work.     Intervention Other  Participate in Cardiac Rehab to regain strength, endurance, and overall physical and mental health.        Personal Goals and Risk Factors Review:      Goals and Risk Factor Review      02/13/15  1818           Increase Aerobic Exercise and Physical Activity   Comments Spoke with Tammy today to review the exercise progam process and how the staff will work with her to slowly increase her workloads to help her build up her stamina.       Hypertension   Goal Participant will see blood pressure controlled within the values of 140/913mHg or within value directed by their physician.       Comments Reviewed the risk factor and the steps to help keep and maintain control of the risk factor: Medication compliance, Nutrition and the exercise program.  Tammy stated understanding of th einformation provided today.       Abnormal Lipids   Goal Cholesterol controlled with medications as prescribed, with individualized exercise RX and with personalized nutrition  plan. Value goals: LDL < '70mg'$ , HDL > '40mg'$ . Participant states understanding of desired cholesterol values and following prescriptions.       Comments Reviewed the risk factor and the steps to help keep and maintain control of the risk factor: Medication compliance, Nutrition and the exercise program.  Tammy stated understanding of th einformation provided today.       Stress   Goal To meet with psychosocial counselor for stress and relaxation information and guidance. To state understanding of performing relaxation techniques and or identifying personal stressors.       Comments Met with psychosocial counselor today          Personal Goals Discharge (Final Personal Goals and Risk Factors Review):      Goals and Risk Factor Review - 02/13/15 1818    Increase Aerobic Exercise and Physical Activity   Comments Spoke with Tammy today to review the exercise progam process and how the staff will work with her to slowly increase her workloads to help her build up her stamina.   Hypertension   Goal Participant will see blood pressure controlled within the values of 140/4m/Hg or within value directed by their physician.   Comments Reviewed the  risk factor and the steps to help keep and maintain control of the risk factor: Medication compliance, Nutrition and the exercise program.  Tammy stated understanding of th einformation provided today.   Abnormal Lipids   Goal Cholesterol controlled with medications as prescribed, with individualized exercise RX and with personalized nutrition plan. Value goals: LDL < '70mg'$ , HDL > '40mg'$ . Participant states understanding of desired cholesterol values and following prescriptions.   Comments Reviewed the risk factor and the steps to help keep and maintain control of the risk factor: Medication compliance, Nutrition and the exercise program.  Tammy stated understanding of th einformation provided today.   Stress   Goal To meet with psychosocial counselor for stress and relaxation information and guidance. To state understanding of performing relaxation techniques and or identifying personal stressors.   Comments Met with psychosocial counselor today      ITP Comments:     ITP Comments      02/16/15 1323           ITP Comments Ready for 30 day review. Continue with ITP.  New to program has 2 visits completed since orietation          Comments:

## 2015-02-16 NOTE — Progress Notes (Signed)
Daily Session Note  Patient Details  Name: IVIONA HOLE MRN: [id number] Date of Birth: [birth year] Referring Provider:  Concha Pyo, MD  Encounter Date: 02/16/2015  Check In:     Session Check In - 02/16/15 1605    Check-In   Staff Present Gerlene Burdock, RN, BSN;Diane Joya Gaskins, RN, BSN;Siris Hoos, BS, ACSM EP-C, Exercise Physiologist   ER physicians immediately available to respond to emergencies See telemetry face sheet for immediately available ER MD   Medication changes reported     No   Fall or balance concerns reported    No   Warm-up and Cool-down Performed on first and last piece of equipment   VAD Patient? No   Pain Assessment   Currently in Pain? No/denies         Goals Met:  Proper associated with RPD/PD & O2 Sat Exercise tolerated well No report of cardiac concerns or symptoms Strength training completed today  Goals Unmet:  Not Applicable  Goals Comments:    Dr. Emily Filbert is Medical Director for [facility] and [facility].

## 2015-02-20 DIAGNOSIS — I214 Non-ST elevation (NSTEMI) myocardial infarction: Secondary | ICD-10-CM | POA: Diagnosis not present

## 2015-02-20 DIAGNOSIS — Z9861 Coronary angioplasty status: Secondary | ICD-10-CM

## 2015-02-20 NOTE — Progress Notes (Signed)
Daily Session Note  Patient Details  Name: Sarah Bowen MRN: [id number] Date of Birth: [birth year] Referring Provider:  Concha Pyo, MD  Encounter Date: 02/20/2015  Check In:     Session Check In - 02/20/15 1605    Check-In   Staff Present Heath Lark, RN, BSN, CCRP;Lena Gores, BS, ACSM EP-C, Exercise Physiologist;Other   ER physicians immediately available to respond to emergencies See telemetry face sheet for immediately available ER MD   Medication changes reported     No   Fall or balance concerns reported    No   Warm-up and Cool-down Performed on first and last piece of equipment   VAD Patient? No   Pain Assessment   Currently in Pain? No/denies         Goals Met:  Proper associated with RPD/PD & O2 Sat Exercise tolerated well No report of cardiac concerns or symptoms Strength training completed today  Goals Unmet:  Not Applicable  Goals Comments:   Dr. Emily Filbert is Medical Director for [place] [facility].

## 2015-02-22 ENCOUNTER — Encounter: Payer: BC Managed Care – PPO | Attending: Internal Medicine | Admitting: *Deleted

## 2015-02-22 DIAGNOSIS — Z9861 Coronary angioplasty status: Secondary | ICD-10-CM | POA: Diagnosis present

## 2015-02-22 DIAGNOSIS — I214 Non-ST elevation (NSTEMI) myocardial infarction: Secondary | ICD-10-CM | POA: Diagnosis not present

## 2015-02-22 NOTE — Progress Notes (Signed)
Daily Session Note  Patient Details  Name: Sarah Bowen MRN: [id number] Date of Birth: 03-08-1965 Referring Provider:  Concha Pyo, MD  Encounter Date: 02/22/2015  Check In:     Session Check In - 02/22/15 1656    Check-In   Staff Present Candiss Norse, MS, ACSM CEP, Exercise Physiologist;Susanne Bice, RN, BSN, CCRP;Carroll Enterkin, RN, BSN   ER physicians immediately available to respond to emergencies See telemetry face sheet for immediately available ER MD   Medication changes reported     No   Fall or balance concerns reported    No   Warm-up and Cool-down Performed on first and last piece of equipment   VAD Patient? No   Pain Assessment   Currently in Pain? No/denies   Multiple Pain Sites No           Exercise Prescription Changes - 02/22/15 1600    Exercise Review   Progression Yes   Response to Exercise   Symptoms None   Comments [name] can now exercise for the full time and exercise progression will now focus on increases in intensity   Duration Progress to 50 minutes of aerobic without signs/symptoms of physical distress   Intensity Rest + 30   Progression Continue progressive overload as per policy without signs/symptoms or physical distress.   Resistance Training   Training Prescription Yes   Weight 3   Reps 10-15   Treadmill   MPH 2   Grade 0   Minutes 15   NuStep   Level 3   Watts 30   Minutes 20   REL-XR   Level 6   Watts 70   Minutes 15   Biostep-RELP   Level 4   Watts 40   Minutes 25      Goals Met:  Independence with exercise equipment Exercise tolerated well Personal goals reviewed Strength training completed today  Goals Unmet:  Not Applicable  Goals Comments: Patient completed exercise prescription and all exercise goals during rehab session. The exercise was tolerated well and the patient is progressing in the program.    Dr. Emily Filbert is Medical Director for [facility] and [facility].

## 2015-02-22 NOTE — Addendum Note (Signed)
Addended by: Rudy Jew on: 02/22/2015 10:50 AM   Modules accepted: Orders

## 2015-02-27 ENCOUNTER — Encounter: Payer: BC Managed Care – PPO | Admitting: *Deleted

## 2015-02-27 DIAGNOSIS — I214 Non-ST elevation (NSTEMI) myocardial infarction: Secondary | ICD-10-CM

## 2015-02-27 DIAGNOSIS — Z9861 Coronary angioplasty status: Secondary | ICD-10-CM

## 2015-02-27 NOTE — Progress Notes (Signed)
Daily Session Note  Patient Details  Name: Sarah Bowen MRN: [id number] Date of Birth: 03/05/1965 Referring Provider:  Roe, Matthew, MD  Encounter Date: 02/27/2015  Check In:     Session Check In - 02/27/15 1614    Check-In   Location [facility]   Staff Present Rebecca Sickles, PT;Renee MacMillan, MS, ACSM CEP, Exercise Physiologist;Susanne Bice, RN, BSN, CCRP   Supervising physician immediately available to respond to emergencies See telemetry face sheet for immediately available ER MD   Medication changes reported     No   Fall or balance concerns reported    No   Warm-up and Cool-down Performed on first and last piece of equipment   Resistance Training Performed Yes   VAD Patient? No   Pain Assessment   Currently in Pain? No/denies   Multiple Pain Sites No           Exercise Prescription Changes - 02/27/15 1600    Response to Exercise   Symptoms None   Duration Progress to 50 minutes of aerobic without signs/symptoms of physical distress   Intensity Rest + 30   Progression Continue progressive overload as per policy without signs/symptoms or physical distress.   Resistance Training   Training Prescription Yes   Weight 3   Reps 10-15   Treadmill   MPH 2   Grade 0   Minutes 15   NuStep   Level 3   Watts 30   Minutes 20   REL-XR   Level 6   Watts 70   Minutes 15   Biostep-RELP   Level 4   Watts 40   Minutes 25      Goals Met:  Independence with exercise equipment Exercise tolerated well No report of cardiac concerns or symptoms Strength training completed today  Goals Unmet:  Not Applicable  Goals Comments: Patient completed exercise prescription and all exercise goals during rehab session. The exercise was tolerated well and the patient is progressing in the program.    Dr. Mark Miller is Medical Director for [facility] and [facility]. 

## 2015-03-02 ENCOUNTER — Encounter: Payer: BC Managed Care – PPO | Admitting: *Deleted

## 2015-03-02 DIAGNOSIS — I214 Non-ST elevation (NSTEMI) myocardial infarction: Secondary | ICD-10-CM

## 2015-03-02 DIAGNOSIS — Z9861 Coronary angioplasty status: Secondary | ICD-10-CM

## 2015-03-02 NOTE — Progress Notes (Signed)
Daily Session Note  Patient Details  Name: Sarah Bowen MRN: [id number] Date of Birth: Mar 13, 1965 Referring Provider:  Concha Pyo, MD  Encounter Date: 03/02/2015  Check In:     Session Check In - 03/02/15 1723    Check-In   Location [facility]   Staff Present Heath Lark, RN, BSN, CCRP;Diane Joya Gaskins, RN, BSN;Paelyn Smick, RN, BSN   Supervising physician immediately available to respond to emergencies See telemetry face sheet for immediately available ER MD   Medication changes reported     No   Fall or balance concerns reported    No   Warm-up and Cool-down Performed on first and last piece of equipment   Resistance Training Performed Yes   VAD Patient? No   Pain Assessment   Currently in Pain? No/denies         Goals Met:  Proper associated with RPD/PD & O2 Sat Exercise tolerated well  Goals Unmet:  Not Applicable  Goals Comments:    Dr. Emily Filbert is Medical Director for [place] and [facility].

## 2015-03-06 ENCOUNTER — Encounter: Payer: BC Managed Care – PPO | Admitting: *Deleted

## 2015-03-06 DIAGNOSIS — Z9861 Coronary angioplasty status: Secondary | ICD-10-CM

## 2015-03-06 DIAGNOSIS — I214 Non-ST elevation (NSTEMI) myocardial infarction: Secondary | ICD-10-CM

## 2015-03-06 NOTE — Progress Notes (Signed)
Daily Session Note  Patient Details  Name: MELLISA ARSHAD MRN: [id number] Date of Birth: 23-Jun-1965 Referring Provider:  Concha Pyo, MD  Encounter Date: 03/06/2015  Check In:     Session Check In - 03/06/15 1621    Check-In   Location [facility]   Staff Present Candiss Norse, MS, ACSM CEP, Exercise Physiologist;Rebecca Sickles, PT;Susanne Bice, RN, BSN, CCRP   Supervising physician immediately available to respond to emergencies See telemetry face sheet for immediately available ER MD   Medication changes reported     No   Fall or balance concerns reported    No   Warm-up and Cool-down Performed on first and last piece of equipment   Resistance Training Performed Yes   VAD Patient? No   Pain Assessment   Currently in Pain? No/denies         Goals Met:  Independence with exercise equipment Exercise tolerated well No report of cardiac concerns or symptoms Strength training completed today  Goals Unmet:  Not Applicable  Goals Comments: Patient completed exercise prescription and all exercise goals during rehab session. The exercise was tolerated well and the patient is progressing in the program.    Dr. Emily Filbert is Medical Director for [place] and [facility].

## 2015-03-08 ENCOUNTER — Encounter: Payer: BC Managed Care – PPO | Admitting: *Deleted

## 2015-03-08 DIAGNOSIS — I214 Non-ST elevation (NSTEMI) myocardial infarction: Secondary | ICD-10-CM

## 2015-03-08 DIAGNOSIS — Z9861 Coronary angioplasty status: Secondary | ICD-10-CM

## 2015-03-08 NOTE — Progress Notes (Signed)
Daily Session Note  Patient Details  Name: TIANA SIVERTSON MRN: [id number] Date of Birth: Aug 14, 1965 Referring Provider:  Concha Pyo, MD  Encounter Date: 03/08/2015  Check In:     Session Check In - 03/08/15 1647    Check-In   Location [facility]   Staff Present Jeanell Sparrow, PT;Diane Joya Gaskins, RN, Drusilla Kanner, MS, ACSM CEP, Exercise Physiologist   Supervising physician immediately available to respond to emergencies See telemetry face sheet for immediately available ER MD   Medication changes reported     No   Fall or balance concerns reported    No   Warm-up and Cool-down Performed on first and last piece of equipment   Resistance Training Performed Yes   VAD Patient? No   Pain Assessment   Currently in Pain? No/denies         Goals Met:  Independence with exercise equipment Exercise tolerated well No report of cardiac concerns or symptoms Strength training completed today  Goals Unmet:  Not Applicable  Goals Comments: Patient completed exercise prescription and all exercise goals during rehab session. The exercise was tolerated well and the patient is progressing in the program.    Dr. Emily Filbert is Medical Director for [place] and [facility].

## 2015-03-09 ENCOUNTER — Encounter: Payer: BC Managed Care – PPO | Admitting: *Deleted

## 2015-03-09 ENCOUNTER — Encounter: Payer: Self-pay | Admitting: Dietician

## 2015-03-09 DIAGNOSIS — I214 Non-ST elevation (NSTEMI) myocardial infarction: Secondary | ICD-10-CM | POA: Diagnosis not present

## 2015-03-09 DIAGNOSIS — Z9861 Coronary angioplasty status: Secondary | ICD-10-CM

## 2015-03-09 NOTE — Progress Notes (Signed)
Daily Session Note  Patient Details  Name: JONALYN SEDLAK MRN: [id number] Date of Birth: [birth year] Referring Provider:  Concha Pyo, MD  Encounter Date: 03/09/2015  Check In:     Session Check In - 03/09/15 1620    Check-In   Location [facility]   Staff Present Gerlene Burdock, RN, Jana Half, RN, BSN;Susanne Bice, RN, BSN, CCRP   Supervising physician immediately available to respond to emergencies See telemetry face sheet for immediately available ER MD   Medication changes reported     No   Fall or balance concerns reported    No   Warm-up and Cool-down Performed on first and last piece of equipment   Resistance Training Performed No   VAD Patient? No   Pain Assessment   Currently in Pain? No/denies         Goals Met:  Proper associated with RPD/PD & O2 Sat Exercise tolerated well  Goals Unmet:  Not Applicable  Goals Comments:    Dr. Emily Filbert is Medical Director for [place] and [facility].

## 2015-03-09 NOTE — Progress Notes (Signed)
Cardiac Individual Treatment Plan  Patient Details  Name: Sarah Bowen MRN: [id number] Date of Birth: 02/01/1965 Referring Provider:  Concha Pyo, MD  Initial Encounter Date:    Visit Diagnosis: NSTEMI (non-ST elevated myocardial infarction) ([place])  S/P PTCA (percutaneous transluminal coronary angioplasty)  Patient's Home Medications on Admission:  Current outpatient prescriptions:  .  amLODipine (NORVASC) 5 MG tablet, Take 5 mg by mouth every morning., Disp: , Rfl:  .  aspirin EC 81 MG tablet, Take 81 mg by mouth every morning., Disp: , Rfl:  .  atorvastatin (LIPITOR) 80 MG tablet, Take 80 mg by mouth at bedtime., Disp: , Rfl:  .  EPINEPHrine 0.3 mg/0.3 mL IJ SOAJ injection, Inject 0.3 mg into the muscle Once PRN., Disp: , Rfl:  .  lisinopril (PRINIVIL,ZESTRIL) 20 MG tablet, Take 20 mg by mouth daily., Disp: , Rfl:  .  magnesium oxide (MAG-OX) 400 MG tablet, Take 400 mg by mouth daily., Disp: , Rfl:  .  metoprolol succinate (TOPROL-XL) 25 MG 24 hr tablet, Take 25 mg by mouth daily., Disp: , Rfl:  .  nitroGLYCERIN (NITROSTAT) 0.4 MG SL tablet, Place 0.4 mg under the tongue every 5 (five) minutes x 3 doses as needed. For Chest Pain, Disp: , Rfl:  .  ticagrelor (BRILINTA) 90 MG TABS tablet, Take 90 mg by mouth every 12 (twelve) hours., Disp: , Rfl:   Past Medical History: No past medical history on file.  Tobacco Use: History  Smoking status  . Never Smoker   Smokeless tobacco  . Never Used    Labs: Recent Review Flowsheet Data    There is no flowsheet data to display.       Exercise Target Goals:    Exercise Program Goal: Individual exercise prescription set with THRR, safety & activity barriers. Participant demonstrates ability to understand and report RPE using BORG scale, to self-measure pulse accurately, and to acknowledge the importance of the exercise prescription.  Exercise Prescription Goal: Starting with aerobic activity 30 plus minutes a day, 3 days per  week for initial exercise prescription. Provide home exercise prescription and guidelines that participant acknowledges understanding prior to discharge.  Activity Barriers & Risk Stratification:     Activity Barriers & Cardiac Risk Stratification - 02/08/15 1522    Activity Barriers & Cardiac Risk Stratification   Activity Barriers None   Cardiac Risk Stratification High      6 Minute Walk:     6 Minute Walk      02/08/15 1438       6 Minute Walk   Phase Initial     Distance 1135 feet     Walk Time 6 minutes     RPE 7     Symptoms No     Resting HR 68 bpm     Resting BP 110/70 mmHg     Max Ex. HR 117 bpm     Max Ex. BP 120/64 mmHg        Initial Exercise Prescription:     Initial Exercise Prescription - 02/08/15 1400    Date of Initial Exercise Prescription   Date 02/08/15   Treadmill   MPH 2   Grade 0   Minutes 15   Bike   Level 0.4   Watts 12   Minutes 10   Recumbant Bike   Level 3   RPM 40   Watts 25   Minutes 15   NuStep   Level 3   Watts 30   Minutes  15   Arm Ergometer   Level 1   Watts 8   Minutes 10   Arm/Foot Ergometer   Level 4   Watts 15   Minutes 10   Cybex   Level 3   RPM 50   Minutes 10   Recumbant Elliptical   Level 1   RPM 40   Watts 10   Minutes 10   Elliptical   Level 1   Speed 3   Minutes 1   REL-XR   Level 3   Watts 40   Minutes 15   T5 Nustep   Level 2   Watts 20   Minutes 15   Biostep-RELP   Level 3   Watts 25   Minutes 15   Prescription Details   Frequency (times per week) 3   Duration Progress to 30 minutes of continuous aerobic without signs/symptoms of physical distress   Intensity   THRR REST +  30   Ratings of Perceived Exertion 11-15   Progression Continue progressive overload as per policy without signs/symptoms or physical distress.   Resistance Training   Training Prescription Yes   Weight 2   Reps 10-15      Exercise Prescription Changes:     Exercise Prescription Changes       02/15/15 1600 02/16/15 1600 02/22/15 1600 02/27/15 1600 03/06/15 0743   Exercise Review   Progression   Yes  Yes   Response to Exercise   Blood Pressure (Admit)     124/76 mmHg   Blood Pressure (Exercise)     136/72 mmHg   Blood Pressure (Exit)     110/62 mmHg   Heart Rate (Admit)     66 bpm   Heart Rate (Exercise)     113 bpm   Heart Rate (Exit)     82 bpm   Rating of Perceived Exertion (Exercise)     12   Symptoms   None None None   Comments   Sarah Bowen can now exercise for the full time and exercise progression will now focus on increases in intensity  Sarah Bowen is steadily increasing in speed on the TM and in workload on the seated equipment. Once she has maxed out on her walking speed we will focus on increases in grade. We will introduce inteval training to Sarah Bowen and see if she wants to pursue this type of training, Interval training will make further progression possible for Sarah Bowen as she has maxed out on the aerobic exercise and can do continuous exercise for 45-50 minutes with no issue.    Duration   Progress to 50 minutes of aerobic without signs/symptoms of physical distress Progress to 50 minutes of aerobic without signs/symptoms of physical distress Progress to 50 minutes of aerobic without signs/symptoms of physical distress   Intensity   Rest + 30 Rest + 30 Rest + 30   Progression   Continue progressive overload as per policy without signs/symptoms or physical distress. Continue progressive overload as per policy without signs/symptoms or physical distress. Continue progressive overload as per policy without signs/symptoms or physical distress.   Resistance Training   Training Prescription   Yes Yes Yes   Weight   _0 Reps   10-15 10-15 10-15   Interval Training   Interval Training     Yes   Equipment     Treadmill;Recumbant Elliptical  REL= BioStep   Treadmill   MPH   _1 Grade  0 0 0   Minutes   _0 NuStep   Level _1 Watts _2 Minutes _3 REL-XR   Level   _4 Watts   70 70 70   Minutes   _5 Biostep-RELP   Level  _6 Watts  40 40 40 40   Minutes  _7 Discharge Exercise Prescription (Final Exercise Prescription Changes):     Exercise Prescription Changes - 03/06/15 0743    Exercise Review   Progression Yes   Response to Exercise   Blood Pressure (Admit) 124/76 mmHg   Blood Pressure (Exercise) 136/72 mmHg   Blood Pressure (Exit) 110/62 mmHg   Heart Rate (Admit) 66 bpm   Heart Rate (Exercise) 113 bpm   Heart Rate (Exit) 82 bpm   Rating of Perceived Exertion (Exercise) 12   Symptoms None   Comments Sarah Bowen is steadily increasing in speed on the TM and in workload on the seated equipment. Once she has maxed out on her walking speed we will focus on increases in grade. We will introduce inteval training to Sarah Bowen and see if she wants to pursue this type of training, Interval training will make further progression possible for Sarah Bowen as she has maxed out on the aerobic exercise and can do continuous exercise for 45-50 minutes with no issue.    Duration Progress to 50 minutes of aerobic without signs/symptoms of physical distress   Intensity Rest + 30   Progression Continue progressive overload as per policy without signs/symptoms or physical distress.   Resistance Training   Training Prescription Yes   Weight 3   Reps 10-15   Interval Training   Interval Training Yes   Equipment Treadmill;Recumbant Elliptical  REL= BioStep   Treadmill   MPH 2   Grade 0   Minutes 15   NuStep   Level 3   Watts 30   Minutes 20   REL-XR   Level 6   Watts 70   Minutes 15   Biostep-RELP   Level 4   Watts 40   Minutes 25      Nutrition:  Target Goals: Understanding of nutrition guidelines, daily intake of sodium <1533m, cholesterol <2038m calories 30% from fat and 7% or less from saturated fats, daily to have 5 or more servings of fruits and vegetables.  Biometrics:     Pre  Biometrics - 02/08/15 1438    Pre Biometrics   Height 5' 4.5" (1.638 m)   Weight 163 lb 14.4 oz (74.345 kg)   Waist Circumference 34 inches   Hip Circumference 40.5 inches   Waist to Hip Ratio 0.84 %   BMI (Calculated) 27.8       Nutrition Therapy Plan and Nutrition Goals:     Nutrition Therapy & Goals - 03/09/15 0935    Nutrition Therapy   Diet basic heart healthy diet, 1400kcal, working towards DASH guidelines   Drug/Food Interactions Statins/Certain Fruits   Fiber 25 grams   Whole Grain Foods 3 servings   Protein 6.5 ounces/day   Saturated Fats 11 max. grams   Fruits and Vegetables 5 servings/day   Personal Nutrition Goals   Personal Goal #1 Increase water intake; gradually add 4-8oz per day    Personal Goal #2 Add some servings of vegetables  and fruits -- plan to include at least one with each meal and/or snack; or monitor your daily servings   Comments Patient reports she is a picky eater, and diet changes are a challenge. Encouraged gradual, manageable changes, and 1-3 changes at a time. She will start with goals listed above.      Nutrition Discharge: Rate Your Plate Scores:     Rate Your Plate - 36/64/40 3474    Rate Your Plate Scores   Pre Score 57   Pre Score % 63 %      Nutrition Goals Re-Evaluation:     Nutrition Goals Re-Evaluation      03/09/15 1622           Personal Goal #1 Re-Evaluation   Goal Progress Seen Yes       Comments Sarah Bowen admitted today "i eat terrible but the dietician was encouraging to get me to eat healthier".           Psychosocial: Target Goals: Acknowledge presence or absence of depression, maximize coping skills, provide positive support system. Participant is able to verbalize types and ability to use techniques and skills needed for reducing stress and depression.  Initial Review & Psychosocial Screening:     Initial Psych Review & Screening - 02/08/15 1731    Family Dynamics   Comments Patient has a 62 year old  daughter and a 40 year old son.  Her sister, Sarah Bowen, who works at [employer], is also supportive and is involved.  Patient scored a 10 on PHQ-9 due in part to problems associated with anoxic brain injury.  Pt is slow to respond and process information.  Pt. is undergoing speech therapy.     Screening Interventions   Interventions Encouraged to exercise;Program counselor consult      Quality of Life Scores:     Quality of Life - 02/08/15 1738    Quality of Life Scores   Health/Function Pre 13.96 %   Socioeconomic Pre 18.69 %   Psych/Spiritual Pre 28.29 %   Family Pre 25.5 %   GLOBAL Pre 19.72 %      PHQ-9:     Recent Review Flowsheet Data    Depression screen [facility] 2/9 02/08/2015   Decreased Interest 0   Down, Depressed, Hopeless 0   PHQ - 2 Score 0   Altered sleeping 0   Tired, decreased energy 3   Change in appetite 3   Feeling bad or failure about yourself  0   Trouble concentrating 1   Moving slowly or fidgety/restless 3   Suicidal thoughts 0   PHQ-9 Score 10   Difficult doing work/chores Somewhat difficult      Psychosocial Evaluation and Intervention:     Psychosocial Evaluation - 02/13/15 1739    Psychosocial Evaluation & Interventions   Interventions Encouraged to exercise with the program and follow exercise prescription   Comments Counselor met with Sarah Bowen today for initial psychosocial evaluation.  She is a 50 year old who went into cardiac arrest in October and was out of oxygen for quite some time, causing some cognitive problems currently.  She has a strong support system with older children living in her home and a sister close by.  She also is actively involved in her local church community.  Sarah Bowen states she sleeps well and although a "picky eater", her appetite is generally okay.  She denies a history of depression or anxiety or current symptoms.  She also states her  mood is typically positive.  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  She also is unable to drive currently.  Her goals for this program are to increase her stamina and strength to be able to go up and down her stairs in her two story home with greater ease.  She plans to look into local resources, or a gym for maintenance upon completion of this program.        Psychosocial Re-Evaluation:     Psychosocial Re-Evaluation      03/08/15 1707 03/09/15 1624         Psychosocial Re-Evaluation   Comments Follow up with Sarah Bowen reporting a positive report from her cardiologist and hoping to be taken off the "life vest" soon.  She states she feels increased stamina since coming into this program and hopes that her neurological condition will also improve in time so that she can return to a more normal life with work and driving again.  She continues to maintain a positive attitude.  Counselor will continue to follow with her.   Sarah Bowen said she has a good support system and feels her stress is undercontrol.          Vocational Rehabilitation: Provide vocational rehab assistance to qualifying candidates.   Vocational Rehab Evaluation & Intervention:     Vocational Rehab - 02/08/15 1523    Initial Vocational Rehab Evaluation & Intervention   Assessment shows need for Vocational Rehabilitation No      Education: Education Goals: Education classes will be provided on a weekly basis, covering required topics. Participant will state understanding/return demonstration of topics presented.  Learning Barriers/Preferences:     Learning Barriers/Preferences - 02/08/15 1523    Learning Barriers/Preferences   Learning Barriers None   Learning Preferences Written Material      Education Topics: General Nutrition Guidelines/Fats and Fiber: -Group instruction provided by verbal, written material, models and posters to present the  general guidelines for heart healthy nutrition. Gives an explanation and review of dietary fats and fiber.          Cardiac Rehab from 03/06/2015 in [facility] Cardiac and Pulmonary Rehab   Date  02/27/15   Educator  PI   Instruction Review Code  2- meets goals/outcomes      Controlling Sodium/Reading Food Labels: -Group verbal and written material supporting the discussion of sodium use in heart healthy nutrition. Review and explanation with models, verbal and written materials for utilization of the food label.      Cardiac Rehab from 03/06/2015 in [facility] Cardiac and Pulmonary Rehab   Date  03/06/15   Educator  PI   Instruction Review Code  2- meets goals/outcomes      Exercise Physiology & Risk Factors: - Group verbal and written instruction with models to review the exercise physiology of the cardiovascular system and associated critical values. Details cardiovascular disease risk factors and the goals associated with each risk factor.   Aerobic Exercise & Resistance Training: - Gives group verbal and written discussion on the health impact of inactivity. On the components of aerobic and resistive training programs and the benefits of this training and how to safely progress through these programs.   Flexibility, Balance, General Exercise Guidelines: - Provides group verbal and written instruction on the benefits of flexibility and balance training programs. Provides general exercise guidelines with specific  guidelines to those with heart or lung disease. Demonstration and skill practice provided.   Stress Management: - Provides group verbal and written instruction about the health risks of elevated stress, cause of high stress, and healthy ways to reduce stress.   Depression: - Provides group verbal and written instruction on the correlation between heart/lung disease and depressed mood, treatment options, and the stigmas associated with seeking treatment.   Anatomy & Physiology of  the Heart: - Group verbal and written instruction and models provide basic cardiac anatomy and physiology, with the coronary electrical and arterial systems. Review of: AMI, Angina, Valve disease, Heart Failure, Cardiac Arrhythmia, Pacemakers, and the ICD.      Cardiac Rehab from 03/06/2015 in [facility] Cardiac and Pulmonary Rehab   Date  02/13/15   Educator  SB   Instruction Review Code  2- meets goals/outcomes      Cardiac Procedures: - Group verbal and written instruction and models to describe the testing methods done to diagnose heart disease. Reviews the outcomes of the test results. Describes the treatment choices: Medical Management, Angioplasty, or Coronary Bypass Surgery.   Cardiac Medications: - Group verbal and written instruction to review commonly prescribed medications for heart disease. Reviews the medication, class of the drug, and side effects. Includes the steps to properly store meds and maintain the prescription regimen.   Go Sex-Intimacy & Heart Disease, Get SMART - Goal Setting: - Group verbal and written instruction through game format to discuss heart disease and the return to sexual intimacy. Provides group verbal and written material to discuss and apply goal setting through the application of the S.M.A.R.T. Method.   Other Matters of the Heart: - Provides group verbal, written materials and models to describe Heart Failure, Angina, Valve Disease, and Diabetes in the realm of heart disease. Includes description of the disease process and treatment options available to the cardiac patient.      Cardiac Rehab from 03/06/2015 in [facility] Cardiac and Pulmonary Rehab   Date  02/20/15   Educator  SB   Instruction Review Code  2- meets goals/outcomes      Exercise & Equipment Safety: - Individual verbal instruction and demonstration of equipment use and safety with use of the equipment.      Cardiac Rehab from 03/06/2015 in [facility] Cardiac and Pulmonary Rehab   Date  02/08/15    Educator  D. Joya Gaskins, RN   Instruction Review Code  1- partially meets, needs review/practice      Infection Prevention: - Provides verbal and written material to individual with discussion of infection control including proper hand washing and proper equipment cleaning during exercise session.      Cardiac Rehab from 03/06/2015 in [facility] Cardiac and Pulmonary Rehab   Date  02/08/15   Educator  D. Joya Gaskins, RN   Instruction Review Code  2- meets goals/outcomes      Falls Prevention: - Provides verbal and written material to individual with discussion of falls prevention and safety.      Cardiac Rehab from 03/06/2015 in [facility] Cardiac and Pulmonary Rehab   Date  02/08/15   Educator  D. Joya Gaskins, RN   Instruction Review Code  2- meets goals/outcomes      Diabetes: - Individual verbal and written instruction to review signs/symptoms of diabetes, desired ranges of glucose level fasting, after meals and with exercise. Advice that pre and post exercise glucose checks will be done for 3 sessions at entry of program.    Knowledge Questionnaire Score:  Knowledge Questionnaire Score - 02/08/15 1523    Knowledge Questionnaire Score   Pre Score 22/28      Personal Goals and Risk Factors at Admission:     Personal Goals and Risk Factors at Admission - 02/08/15 1527    Personal Goals and Risk Factors on Admission   Increase Aerobic Exercise and Physical Activity Yes   Intervention While in program, learn and follow the exercise prescription taught. Start at a low level workload and increase workload after able to maintain previous level for 30 minutes. Increase time before increasing intensity.   Take Less Medication Yes   Intervention Learn your risk factors and begin the lifestyle modifications for risk factor control during your time in the program.   Understand more about Heart/Pulmonary Disease. Yes   Intervention While in program utilize professionals for any questions, and attend the  education sessions. Great websites to use are www.americanheart.org or www.lung.org for reliable information.   Hypertension Yes   Goal Participant will see blood pressure controlled within the values of 140/14m/Hg or within value directed by their physician.   Intervention Provide nutrition & aerobic exercise along with prescribed medications to achieve BP 140/90 or less.   Lipids Yes   Goal Cholesterol controlled with medications as prescribed, with individualized exercise RX and with personalized nutrition plan. Value goals: LDL < 757m HDL > 4077mParticipant states understanding of desired cholesterol values and following prescriptions.   Intervention Provide nutrition & aerobic exercise along with prescribed medications to achieve LDL <59m63mDL >40mg75mStress Yes   Goal To meet with psychosocial counselor for stress and relaxation information and guidance. To state understanding of performing relaxation techniques and or identifying personal stressors.   Intervention Provide education on types of stress, identifiying stressors, and ways to cope with stress. Provide demonstration and active practice of relaxation techniques.   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  Due to her cardiac arrest, NSTEMI, and PCI patient has not been able return to work.  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  She is seeing [employer] would like to return to work.     Intervention Other  Participate in Cardiac Rehab to regain strength, endurance, and overall physical and mental health.        Personal Goals and Risk Factors Review:      Goals and Risk Factor Review      02/13/15 1818 03/09/15 1622         Increase Aerobic Exercise and Physical Activity   Comments Spoke with Tammy today to review the exercise progam process and how the staff will  work with her to slowly increase her workloads to help her build up her stamina.       Hypertension   Goal Participant will see blood pressure controlled within the values of 140/90mm/80mr within value directed by their physician.       Progress seen toward goals  Yes      Comments Reviewed the risk factor and the steps to help keep and maintain control of the risk factor: Medication compliance, Nutrition and the exercise program.  Tammy stated understanding of th einformation provided today. Lillan's blood pressure is 130/-1016/-553lic /70   /74Abnormal Lipids   Goal Cholesterol controlled with medications as prescribed, with individualized exercise RX and with personalized nutrition plan.  Value goals: LDL < 42m, HDL > 441m Participant states understanding of desired cholesterol values and following prescriptions.       Progress seen towards goals  Unknown      Comments Reviewed the risk factor and the steps to help keep and maintain control of the risk factor: Medication compliance, Nutrition and the exercise program.  Tammy stated understanding of th einformation provided today.       Stress   Goal To meet with psychosocial counselor for stress and relaxation information and guidance. To state understanding of performing relaxation techniques and or identifying personal stressors.       Progress seen towards goals  Yes      Comments Met with psychosocial counselor today TaHollaceaid she has a good support system and feels her stress is undercontrol.          Personal Goals Discharge (Final Personal Goals and Risk Factors Review):      Goals and Risk Factor Review - 03/09/15 1622    Hypertension   Progress seen toward goals Yes   Comments Aldina's blood pressure is 13854/-627ystolic /7/03 Abnormal Lipids   Progress seen towards goals Unknown   Stress   Progress seen towards goals Yes   Comments TaImanaid she has a good support system and feels her stress is undercontrol.        ITP Comments:     ITP Comments      02/16/15 1323 03/02/15 1724         ITP Comments Ready for 30 day review. Continue with ITP.  New to program has 2 visits completed since orietation TaMyeshiaaid she is working with a Neuropsychologist who is having her do puzzles to increase her memory and function of her mind.          Comments:

## 2015-03-13 DIAGNOSIS — Z9861 Coronary angioplasty status: Secondary | ICD-10-CM

## 2015-03-13 DIAGNOSIS — I214 Non-ST elevation (NSTEMI) myocardial infarction: Secondary | ICD-10-CM | POA: Diagnosis not present

## 2015-03-13 NOTE — Progress Notes (Signed)
Daily Session Note  Patient Details  Name: CRYSTALL DONALDSON MRN: [id number] Date of Birth: 05-29-1965 Referring Provider:  Concha Pyo, MD  Encounter Date: 03/13/2015  Check In:     Session Check In - 03/13/15 1609    Check-In   Location [facility]   Staff Present Lestine Box, BS, ACSM EP-C, Exercise Physiologist;Susanne Bice, RN, BSN, CCRP;Rebecca Sickles, PT   Supervising physician immediately available to respond to emergencies See telemetry face sheet for immediately available ER MD   Medication changes reported     No   Fall or balance concerns reported    No   Warm-up and Cool-down Performed on first and last piece of equipment   Resistance Training Performed No   VAD Patient? No   Pain Assessment   Currently in Pain? No/denies         Goals Met:  Proper associated with RPD/PD & O2 Sat Exercise tolerated well No report of cardiac concerns or symptoms Strength training completed today  Goals Unmet:  Not Applicable  Goals Comments:    Dr. Emily Filbert is Medical Director for [facility].

## 2015-03-14 NOTE — Progress Notes (Signed)
Cardiac Individual Treatment Plan  Patient Details  Name: Sarah Bowen MRN: [id number] Date of Birth: Oct 06, 1965 Referring Provider:  Concha Pyo, MD  Initial Encounter Date:    Visit Diagnosis: NSTEMI (non-ST elevated myocardial infarction) ([place])  S/P PTCA (percutaneous transluminal coronary angioplasty)  Patient's Home Medications on Admission:  Current outpatient prescriptions:  .  amLODipine (NORVASC) 5 MG tablet, Take 5 mg by mouth every morning., Disp: , Rfl:  .  aspirin EC 81 MG tablet, Take 81 mg by mouth every morning., Disp: , Rfl:  .  atorvastatin (LIPITOR) 80 MG tablet, Take 80 mg by mouth at bedtime., Disp: , Rfl:  .  EPINEPHrine 0.3 mg/0.3 mL IJ SOAJ injection, Inject 0.3 mg into the muscle Once PRN., Disp: , Rfl:  .  lisinopril (PRINIVIL,ZESTRIL) 20 MG tablet, Take 20 mg by mouth daily., Disp: , Rfl:  .  magnesium oxide (MAG-OX) 400 MG tablet, Take 400 mg by mouth daily., Disp: , Rfl:  .  metoprolol succinate (TOPROL-XL) 25 MG 24 hr tablet, Take 25 mg by mouth daily., Disp: , Rfl:  .  nitroGLYCERIN (NITROSTAT) 0.4 MG SL tablet, Place 0.4 mg under the tongue every 5 (five) minutes x 3 doses as needed. For Chest Pain, Disp: , Rfl:  .  ticagrelor (BRILINTA) 90 MG TABS tablet, Take 90 mg by mouth every 12 (twelve) hours., Disp: , Rfl:   Past Medical History: No past medical history on file.  Tobacco Use: History  Smoking status  . Never Smoker   Smokeless tobacco  . Never Used    Labs: Recent Review Flowsheet Data    There is no flowsheet data to display.       Exercise Target Goals:    Exercise Program Goal: Individual exercise prescription set with THRR, safety & activity barriers. Participant demonstrates ability to understand and report RPE using BORG scale, to self-measure pulse accurately, and to acknowledge the importance of the exercise prescription.  Exercise Prescription Goal: Starting with aerobic activity 30 plus minutes a day, 3 days per  week for initial exercise prescription. Provide home exercise prescription and guidelines that participant acknowledges understanding prior to discharge.  Activity Barriers & Risk Stratification:     Activity Barriers & Cardiac Risk Stratification - 02/08/15 1522    Activity Barriers & Cardiac Risk Stratification   Activity Barriers None   Cardiac Risk Stratification High      6 Minute Walk:     6 Minute Walk      02/08/15 1438       6 Minute Walk   Phase Initial     Distance 1135 feet     Walk Time 6 minutes     RPE 7     Symptoms No     Resting HR 68 bpm     Resting BP 110/70 mmHg     Max Ex. HR 117 bpm     Max Ex. BP 120/64 mmHg        Initial Exercise Prescription:     Initial Exercise Prescription - 02/08/15 1400    Date of Initial Exercise Prescription   Date 02/08/15   Treadmill   MPH 2   Grade 0   Minutes 15   Bike   Level 0.4   Watts 12   Minutes 10   Recumbant Bike   Level 3   RPM 40   Watts 25   Minutes 15   NuStep   Level 3   Watts 30   Minutes  15   Arm Ergometer   Level 1   Watts 8   Minutes 10   Arm/Foot Ergometer   Level 4   Watts 15   Minutes 10   Cybex   Level 3   RPM 50   Minutes 10   Recumbant Elliptical   Level 1   RPM 40   Watts 10   Minutes 10   Elliptical   Level 1   Speed 3   Minutes 1   REL-XR   Level 3   Watts 40   Minutes 15   T5 Nustep   Level 2   Watts 20   Minutes 15   Biostep-RELP   Level 3   Watts 25   Minutes 15   Prescription Details   Frequency (times per week) 3   Duration Progress to 30 minutes of continuous aerobic without signs/symptoms of physical distress   Intensity   THRR REST +  30   Ratings of Perceived Exertion 11-15   Progression Continue progressive overload as per policy without signs/symptoms or physical distress.   Resistance Training   Training Prescription Yes   Weight 2   Reps 10-15      Exercise Prescription Changes:     Exercise Prescription Changes       02/15/15 1600 02/16/15 1600 02/22/15 1600 02/27/15 1600 03/06/15 0743   Exercise Review   Progression   Yes  Yes   Response to Exercise   Blood Pressure (Admit)     124/76 mmHg   Blood Pressure (Exercise)     136/72 mmHg   Blood Pressure (Exit)     110/62 mmHg   Heart Rate (Admit)     66 bpm   Heart Rate (Exercise)     113 bpm   Heart Rate (Exit)     82 bpm   Rating of Perceived Exertion (Exercise)     12   Symptoms   None None None   Comments   [name] can now exercise for the full time and exercise progression will now focus on increases in intensity  [name] is steadily increasing in speed on the TM and in workload on the seated equipment. Once she has maxed out on her walking speed we will focus on increases in grade. We will introduce inteval training to [facility] and see if she wants to pursue this type of training, Interval training will make further progression possible for [name] as she has maxed out on the aerobic exercise and can do continuous exercise for 45-50 minutes with no issue.    Duration   Progress to 50 minutes of aerobic without signs/symptoms of physical distress Progress to 50 minutes of aerobic without signs/symptoms of physical distress Progress to 50 minutes of aerobic without signs/symptoms of physical distress   Intensity   Rest + 30 Rest + 30 Rest + 30   Progression   Continue progressive overload as per policy without signs/symptoms or physical distress. Continue progressive overload as per policy without signs/symptoms or physical distress. Continue progressive overload as per policy without signs/symptoms or physical distress.   Resistance Training   Training Prescription   Yes Yes Yes   Weight   _0 Reps   10-15 10-15 10-15   Interval Training   Interval Training     Yes   Equipment     Treadmill;Recumbant Elliptical  REL= BioStep   Treadmill   MPH   _1 Grade  0 0 0   Minutes   _0 NuStep   Level _1 Watts _2 Minutes _3 REL-XR   Level   _4 Watts   70 70 70   Minutes   _5 Biostep-RELP   Level  _6 Watts  40 40 40 40   Minutes  _7 Discharge Exercise Prescription (Final Exercise Prescription Changes):     Exercise Prescription Changes - 03/06/15 0743    Exercise Review   Progression Yes   Response to Exercise   Blood Pressure (Admit) 124/76 mmHg   Blood Pressure (Exercise) 136/72 mmHg   Blood Pressure (Exit) 110/62 mmHg   Heart Rate (Admit) 66 bpm   Heart Rate (Exercise) 113 bpm   Heart Rate (Exit) 82 bpm   Rating of Perceived Exertion (Exercise) 12   Symptoms None   Comments [name] is steadily increasing in speed on the TM and in workload on the seated equipment. Once she has maxed out on her walking speed we will focus on increases in grade. We will introduce inteval training to [place] and see if she wants to pursue this type of training, Interval training will make further progression possible for [name] as she has maxed out on the aerobic exercise and can do continuous exercise for 45-50 minutes with no issue.    Duration Progress to 50 minutes of aerobic without signs/symptoms of physical distress   Intensity Rest + 30   Progression Continue progressive overload as per policy without signs/symptoms or physical distress.   Resistance Training   Training Prescription Yes   Weight 3   Reps 10-15   Interval Training   Interval Training Yes   Equipment Treadmill;Recumbant Elliptical  REL= BioStep   Treadmill   MPH 2   Grade 0   Minutes 15   NuStep   Level 3   Watts 30   Minutes 20   REL-XR   Level 6   Watts 70   Minutes 15   Biostep-RELP   Level 4   Watts 40   Minutes 25      Nutrition:  Target Goals: Understanding of nutrition guidelines, daily intake of sodium <1570m, cholesterol <206m calories 30% from fat and 7% or less from saturated fats, daily to have 5 or more servings of fruits and vegetables.  Biometrics:     Pre  Biometrics - 02/08/15 1438    Pre Biometrics   Height 5' 4.5" (1.638 m)   Weight 163 lb 14.4 oz (74.345 kg)   Waist Circumference 34 inches   Hip Circumference 40.5 inches   Waist to Hip Ratio 0.84 %   BMI (Calculated) 27.8       Nutrition Therapy Plan and Nutrition Goals:     Nutrition Therapy & Goals - 03/09/15 0935    Nutrition Therapy   Diet basic heart healthy diet, 1400kcal, working towards DASH guidelines   Drug/Food Interactions Statins/Certain Fruits   Fiber 25 grams   Whole Grain Foods 3 servings   Protein 6.5 ounces/day   Saturated Fats 11 max. grams   Fruits and Vegetables 5 servings/day   Personal Nutrition Goals   Personal Goal #1 Increase water intake; gradually add 4-8oz per day    Personal Goal #2 Add some servings of vegetables  and fruits -- plan to include at least one with each meal and/or snack; or monitor your daily servings   Comments Patient reports she is a picky eater, and diet changes are a challenge. Encouraged gradual, manageable changes, and 1-3 changes at a time. She will start with goals listed above.      Nutrition Discharge: Rate Your Plate Scores:     Rate Your Plate - 36/64/40 3474    Rate Your Plate Scores   Pre Score 57   Pre Score % 63 %      Nutrition Goals Re-Evaluation:     Nutrition Goals Re-Evaluation      03/09/15 1622           Personal Goal #1 Re-Evaluation   Goal Progress Seen Yes       Comments Shineka admitted today "i eat terrible but the dietician was encouraging to get me to eat healthier".           Psychosocial: Target Goals: Acknowledge presence or absence of depression, maximize coping skills, provide positive support system. Participant is able to verbalize types and ability to use techniques and skills needed for reducing stress and depression.  Initial Review & Psychosocial Screening:     Initial Psych Review & Screening - 02/08/15 1731    Family Dynamics   Comments Patient has a 62 year old  daughter and a 40 year old son.  Her sister, Dereck Ligas, who works at [employer], is also supportive and is involved.  Patient scored a 10 on PHQ-9 due in part to problems associated with anoxic brain injury.  Pt is slow to respond and process information.  Pt. is undergoing speech therapy.     Screening Interventions   Interventions Encouraged to exercise;Program counselor consult      Quality of Life Scores:     Quality of Life - 02/08/15 1738    Quality of Life Scores   Health/Function Pre 13.96 %   Socioeconomic Pre 18.69 %   Psych/Spiritual Pre 28.29 %   Family Pre 25.5 %   GLOBAL Pre 19.72 %      PHQ-9:     Recent Review Flowsheet Data    Depression screen [facility] 2/9 02/08/2015   Decreased Interest 0   Down, Depressed, Hopeless 0   PHQ - 2 Score 0   Altered sleeping 0   Tired, decreased energy 3   Change in appetite 3   Feeling bad or failure about yourself  0   Trouble concentrating 1   Moving slowly or fidgety/restless 3   Suicidal thoughts 0   PHQ-9 Score 10   Difficult doing work/chores Somewhat difficult      Psychosocial Evaluation and Intervention:     Psychosocial Evaluation - 02/13/15 1739    Psychosocial Evaluation & Interventions   Interventions Encouraged to exercise with the program and follow exercise prescription   Comments Counselor met with Ms. Thinnes today for initial psychosocial evaluation.  She is a 50 year old who went into cardiac arrest in October and was out of oxygen for quite some time, causing some cognitive problems currently.  She has a strong support system with older children living in her home and a sister close by.  She also is actively involved in her local church community.  Ms. Dame states she sleeps well and although a "picky eater", her appetite is generally okay.  She denies a history of depression or anxiety or current symptoms.  She also states her  mood is typically positive.  Ms. 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  She also is unable to drive currently.  Her goals for this program are to increase her stamina and strength to be able to go up and down her stairs in her two story home with greater ease.  She plans to look into local resources, or a gym for maintenance upon completion of this program.        Psychosocial Re-Evaluation:     Psychosocial Re-Evaluation      03/08/15 1707 03/09/15 1624         Psychosocial Re-Evaluation   Comments Follow up with Ms. Salvaggio reporting a positive report from her cardiologist and hoping to be taken off the "life vest" soon.  She states she feels increased stamina since coming into this program and hopes that her neurological condition will also improve in time so that she can return to a more normal life with work and driving again.  She continues to maintain a positive attitude.  Counselor will continue to follow with her.   Sela said she has a good support system and feels her stress is undercontrol.          Vocational Rehabilitation: Provide vocational rehab assistance to qualifying candidates.   Vocational Rehab Evaluation & Intervention:     Vocational Rehab - 02/08/15 1523    Initial Vocational Rehab Evaluation & Intervention   Assessment shows need for Vocational Rehabilitation No      Education: Education Goals: Education classes will be provided on a weekly basis, covering required topics. Participant will state understanding/return demonstration of topics presented.  Learning Barriers/Preferences:     Learning Barriers/Preferences - 02/08/15 1523    Learning Barriers/Preferences   Learning Barriers None   Learning Preferences Written Material      Education Topics: General Nutrition Guidelines/Fats and Fiber: -Group instruction provided by verbal, written material, models and posters to present the  general guidelines for heart healthy nutrition. Gives an explanation and review of dietary fats and fiber.          Cardiac Rehab from 03/13/2015 in [facility] Cardiac and Pulmonary Rehab   Date  02/27/15   Educator  PI   Instruction Review Code  2- meets goals/outcomes      Controlling Sodium/Reading Food Labels: -Group verbal and written material supporting the discussion of sodium use in heart healthy nutrition. Review and explanation with models, verbal and written materials for utilization of the food label.      Cardiac Rehab from 03/13/2015 in [facility] Cardiac and Pulmonary Rehab   Date  03/06/15   Educator  PI   Instruction Review Code  2- meets goals/outcomes      Exercise Physiology & Risk Factors: - Group verbal and written instruction with models to review the exercise physiology of the cardiovascular system and associated critical values. Details cardiovascular disease risk factors and the goals associated with each risk factor.   Aerobic Exercise & Resistance Training: - Gives group verbal and written discussion on the health impact of inactivity. On the components of aerobic and resistive training programs and the benefits of this training and how to safely progress through these programs.   Flexibility, Balance, General Exercise Guidelines: - Provides group verbal and written instruction on the benefits of flexibility and balance training programs. Provides general exercise guidelines with specific  guidelines to those with heart or lung disease. Demonstration and skill practice provided.   Stress Management: - Provides group verbal and written instruction about the health risks of elevated stress, cause of high stress, and healthy ways to reduce stress.   Depression: - Provides group verbal and written instruction on the correlation between heart/lung disease and depressed mood, treatment options, and the stigmas associated with seeking treatment.   Anatomy & Physiology of  the Heart: - Group verbal and written instruction and models provide basic cardiac anatomy and physiology, with the coronary electrical and arterial systems. Review of: AMI, Angina, Valve disease, Heart Failure, Cardiac Arrhythmia, Pacemakers, and the ICD.      Cardiac Rehab from 03/13/2015 in [facility] Cardiac and Pulmonary Rehab   Date  02/13/15   Educator  SB   Instruction Review Code  2- meets goals/outcomes      Cardiac Procedures: - Group verbal and written instruction and models to describe the testing methods done to diagnose heart disease. Reviews the outcomes of the test results. Describes the treatment choices: Medical Management, Angioplasty, or Coronary Bypass Surgery.      Cardiac Rehab from 03/13/2015 in [facility] Cardiac and Pulmonary Rehab   Date  03/13/15   Educator  SB   Instruction Review Code  2- meets goals/outcomes      Cardiac Medications: - Group verbal and written instruction to review commonly prescribed medications for heart disease. Reviews the medication, class of the drug, and side effects. Includes the steps to properly store meds and maintain the prescription regimen.   Go Sex-Intimacy & Heart Disease, Get SMART - Goal Setting: - Group verbal and written instruction through game format to discuss heart disease and the return to sexual intimacy. Provides group verbal and written material to discuss and apply goal setting through the application of the S.M.A.R.T. Method.      Cardiac Rehab from 03/13/2015 in [facility] Cardiac and Pulmonary Rehab   Date  03/13/15   Educator  SB   Instruction Review Code  2- meets goals/outcomes      Other Matters of the Heart: - Provides group verbal, written materials and models to describe Heart Failure, Angina, Valve Disease, and Diabetes in the realm of heart disease. Includes description of the disease process and treatment options available to the cardiac patient.      Cardiac Rehab from 03/13/2015 in [facility] Cardiac and Pulmonary Rehab    Date  02/20/15   Educator  SB   Instruction Review Code  2- meets goals/outcomes      Exercise & Equipment Safety: - Individual verbal instruction and demonstration of equipment use and safety with use of the equipment.      Cardiac Rehab from 03/13/2015 in [facility] Cardiac and Pulmonary Rehab   Date  02/08/15   Educator  D. Joya Gaskins, RN   Instruction Review Code  1- partially meets, needs review/practice      Infection Prevention: - Provides verbal and written material to individual with discussion of infection control including proper hand washing and proper equipment cleaning during exercise session.      Cardiac Rehab from 03/13/2015 in [facility] Cardiac and Pulmonary Rehab   Date  02/08/15   Educator  D. Joya Gaskins, RN   Instruction Review Code  2- meets goals/outcomes      Falls Prevention: - Provides verbal and written material to individual with discussion of falls prevention and safety.      Cardiac Rehab from 03/13/2015 in [facility] Cardiac and Pulmonary Rehab  Date  02/08/15   Educator  D. Joya Gaskins, RN   Instruction Review Code  2- meets goals/outcomes      Diabetes: - Individual verbal and written instruction to review signs/symptoms of diabetes, desired ranges of glucose level fasting, after meals and with exercise. Advice that pre and post exercise glucose checks will be done for 3 sessions at entry of program.    Knowledge Questionnaire Score:     Knowledge Questionnaire Score - 02/08/15 1523    Knowledge Questionnaire Score   Pre Score 22/28      Personal Goals and Risk Factors at Admission:     Personal Goals and Risk Factors at Admission - 02/08/15 1527    Personal Goals and Risk Factors on Admission   Increase Aerobic Exercise and Physical Activity Yes   Intervention While in program, learn and follow the exercise prescription taught. Start at a low level workload and increase workload after able to maintain previous level for 30 minutes. Increase time before  increasing intensity.   Take Less Medication Yes   Intervention Learn your risk factors and begin the lifestyle modifications for risk factor control during your time in the program.   Understand more about Heart/Pulmonary Disease. Yes   Intervention While in program utilize professionals for any questions, and attend the education sessions. Great websites to use are www.americanheart.org or www.lung.org for reliable information.   Hypertension Yes   Goal Participant will see blood pressure controlled within the values of 140/52m/Hg or within value directed by their physician.   Intervention Provide nutrition & aerobic exercise along with prescribed medications to achieve BP 140/90 or less.   Lipids Yes   Goal Cholesterol controlled with medications as prescribed, with individualized exercise RX and with personalized nutrition plan. Value goals: LDL < 724m HDL > 4044mParticipant states understanding of desired cholesterol values and following prescriptions.   Intervention Provide nutrition & aerobic exercise along with prescribed medications to achieve LDL <72m56mDL >40mg53mStress Yes   Goal To meet with psychosocial counselor for stress and relaxation information and guidance. To state understanding of performing relaxation techniques and or identifying personal stressors.   Intervention Provide education on types of stress, identifiying stressors, and ways to cope with stress. Provide demonstration and active practice of relaxation techniques.   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  Due to her cardiac arrest, NSTEMI, and PCI patient has not been able return to work.  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  She is seeing [employer] would like to return to work.     Intervention Other  Participate in Cardiac Rehab to regain strength,  endurance, and overall physical and mental health.        Personal Goals and Risk Factors Review:      Goals and Risk Factor Review      02/13/15 1818 03/09/15 1622         Increase Aerobic Exercise and Physical Activity   Comments Spoke with Tammy today to review the exercise progam process and how the staff will work with her to slowly increase her workloads to help her build up her stamina.       Hypertension   Goal Participant will see blood pressure controlled within the values of 140/90mm/34mr within value directed by their physician.       Progress seen  toward goals  Yes      Comments Reviewed the risk factor and the steps to help keep and maintain control of the risk factor: Medication compliance, Nutrition and the exercise program.  Tammy stated understanding of th einformation provided today. Alilah's blood pressure is 161/-096 systolic /04      Abnormal Lipids   Goal Cholesterol controlled with medications as prescribed, with individualized exercise RX and with personalized nutrition plan. Value goals: LDL < 33m, HDL > 434m Participant states understanding of desired cholesterol values and following prescriptions.       Progress seen towards goals  Unknown      Comments Reviewed the risk factor and the steps to help keep and maintain control of the risk factor: Medication compliance, Nutrition and the exercise program.  Tammy stated understanding of th einformation provided today.       Stress   Goal To meet with psychosocial counselor for stress and relaxation information and guidance. To state understanding of performing relaxation techniques and or identifying personal stressors.       Progress seen towards goals  Yes      Comments Met with psychosocial counselor today TaKenneishaaid she has a good support system and feels her stress is undercontrol.          Personal Goals Discharge (Final Personal Goals and Risk Factors Review):      Goals and Risk Factor Review -  03/09/15 1622    Hypertension   Progress seen toward goals Yes   Comments Laraya's blood pressure is 13540/-981ystolic /7/19 Abnormal Lipids   Progress seen towards goals Unknown   Stress   Progress seen towards goals Yes   Comments TaNasyaaid she has a good support system and feels her stress is undercontrol.       ITP Comments:     ITP Comments      02/16/15 1323 03/02/15 1724 03/13/15 1707 03/14/15 0735     ITP Comments Ready for 30 day review. Continue with ITP.  New to program has 2 visits completed since orietation TaDeetraaid she is working with a Neuropsychologist who is having her do puzzles to increase her memory and function of her mind.  Tammy saw her physician Friday, does not have to wear her Lifevest any longer.   30 day review.  Continue with ITP       Comments:

## 2015-03-15 ENCOUNTER — Encounter: Payer: BC Managed Care – PPO | Admitting: *Deleted

## 2015-03-15 DIAGNOSIS — Z9861 Coronary angioplasty status: Secondary | ICD-10-CM

## 2015-03-15 DIAGNOSIS — I214 Non-ST elevation (NSTEMI) myocardial infarction: Secondary | ICD-10-CM

## 2015-03-15 NOTE — Progress Notes (Signed)
Daily Session Note  Patient Details  Name: Sarah Bowen MRN: [id number] Date of Birth: 02/10/1965 Referring Provider:  Concha Pyo, MD  Encounter Date: 03/15/2015  Check In:     Session Check In - 03/15/15 1622    Check-In   Location [facility]   Staff Present Gerlene Burdock, RN, Drusilla Kanner, MS, ACSM CEP, Exercise Physiologist;Diane Joya Gaskins, RN, BSN   Supervising physician immediately available to respond to emergencies See telemetry face sheet for immediately available ER MD   Medication changes reported     No   Fall or balance concerns reported    No   Warm-up and Cool-down Performed on first and last piece of equipment   Resistance Training Performed Yes   VAD Patient? No   Pain Assessment   Currently in Pain? No/denies   Multiple Pain Sites No           Exercise Prescription Changes - 03/15/15 1600    Exercise Review   Progression Yes   Response to Exercise   Blood Pressure (Admit) --   Blood Pressure (Exercise) --   Blood Pressure (Exit) --   Heart Rate (Admit) --   Heart Rate (Exercise) --   Heart Rate (Exit) --   Rating of Perceived Exertion (Exercise) --   Symptoms --   Comments --   Duration Progress to 50 minutes of aerobic without signs/symptoms of physical distress   Intensity Rest + 30   Progression Continue progressive overload as per policy without signs/symptoms or physical distress.   Resistance Training   Training Prescription Yes   Weight 3   Reps 10-15   Interval Training   Interval Training Yes   Equipment Treadmill;Recumbant Elliptical  REL= BioStep   Treadmill   MPH 2   Grade 0   Minutes 15   NuStep   Level 3   Watts 30   Minutes 20   REL-XR   Level 6   Watts 70   Minutes 15   Biostep-RELP   Level 4   Watts 40   Minutes 25      Goals Met:  Independence with exercise equipment Exercise tolerated well Personal goals reviewed No report of cardiac concerns or symptoms Strength training  completed today  Goals Unmet:  Not Applicable  Goals Comments:  Patient completed exercise prescription and all exercise goals during rehab session. The exercise was tolerated well and the patient is progressing in the program. Patient no longer wearing Life Vest and per MD patient does not need defibrillator.   Patient scheduled for Stress Test tomorrow.     Dr. Emily Filbert is Medical Director for [place] and [facility].

## 2015-03-20 DIAGNOSIS — I214 Non-ST elevation (NSTEMI) myocardial infarction: Secondary | ICD-10-CM | POA: Diagnosis not present

## 2015-03-20 NOTE — Progress Notes (Signed)
Daily Session Note  Patient Details  Name: Sarah Bowen MRN: [id number] Date of Birth: Apr 23, 1965 Referring Provider:  Concha Pyo, MD  Encounter Date: 03/20/2015  Check In:     Session Check In - 03/20/15 1647    Check-In   Location [facility]   Staff Present Heath Lark, RN, BSN, CCRP;Steven Way, BS, ACSM EP-C, Exercise Physiologist;Korynn Kenedy, PT   Supervising physician immediately available to respond to emergencies See telemetry face sheet for immediately available ER MD   Medication changes reported     No   Fall or balance concerns reported    No   Warm-up and Cool-down Performed on first and last piece of equipment   Resistance Training Performed Yes   VAD Patient? No   Pain Assessment   Currently in Pain? No/denies         Goals Met:  Independence with exercise equipment Exercise tolerated well No report of cardiac concerns or symptoms  Goals Unmet:  Not Applicable  Comments: Patient completed exercise prescription and all exercise goals during rehab session. The exercise was tolerated well and the patient is progressing in the program.   Dr. Emily Filbert is Medical Director for [place] [facility].

## 2015-03-22 ENCOUNTER — Encounter: Payer: BC Managed Care – PPO | Attending: Internal Medicine | Admitting: *Deleted

## 2015-03-22 DIAGNOSIS — Z9861 Coronary angioplasty status: Secondary | ICD-10-CM | POA: Insufficient documentation

## 2015-03-22 DIAGNOSIS — I214 Non-ST elevation (NSTEMI) myocardial infarction: Secondary | ICD-10-CM | POA: Diagnosis not present

## 2015-03-22 NOTE — Addendum Note (Signed)
Addended by: Rudy Jew on: 03/22/2015 11:12 AM   Modules accepted: Orders

## 2015-03-22 NOTE — Progress Notes (Signed)
Daily Session Note  Patient Details  Name: Sarah Bowen MRN: [id number] Date of Birth: 19-Apr-1965 Referring Provider:  Concha Pyo, MD  Encounter Date: 03/22/2015  Check In:     Session Check In - 03/22/15 1622    Check-In   Location [facility]   Staff Present Nyoka Cowden, RN;Rebecca Sickles, PT;Panayiota Larkin Joya Gaskins, RN, BSN   Supervising physician immediately available to respond to emergencies See telemetry face sheet for immediately available ER MD   Medication changes reported     No   Fall or balance concerns reported    No   Warm-up and Cool-down Performed on first and last piece of equipment   Resistance Training Performed Yes   VAD Patient? No   Pain Assessment   Currently in Pain? No/denies   Multiple Pain Sites No           Exercise Prescription Changes - 03/22/15 1600    Exercise Review   Progression Yes   Response to Exercise   Duration Progress to 50 minutes of aerobic without signs/symptoms of physical distress   Intensity Rest + 30   Progression   Progression Continue progressive overload as per policy without signs/symptoms or physical distress.   Interval Training   Interval Training Yes   Equipment Treadmill;Recumbant Elliptical  REL= BioStep   REL-XR   Level 4   Watts 75   Minutes 15      Goals Met:  Independence with exercise equipment Exercise tolerated well Personal goals reviewed No report of cardiac concerns or symptoms Strength training completed today  Goals Unmet:  Not Applicable  Comments:   Patient completed exercise prescription and all exercise goals during rehab session. The exercise was tolerated well and the patient is progressing in the program.    Dr. Emily Filbert is Medical Director for [place] [facility].

## 2015-03-27 DIAGNOSIS — Z9861 Coronary angioplasty status: Secondary | ICD-10-CM

## 2015-03-27 DIAGNOSIS — I214 Non-ST elevation (NSTEMI) myocardial infarction: Secondary | ICD-10-CM

## 2015-03-27 NOTE — Progress Notes (Signed)
Daily Session Note  Patient Details  Name: SHAMEL GALYEAN MRN: [id number] Date of Birth: 17-Jun-1965 Referring Provider:  Concha Pyo, MD  Encounter Date: 03/27/2015  Check In:     Session Check In - 03/27/15 1614    Check-In   Location [facility]   Staff Present Heath Lark, RN, BSN, CCRP;Maxamilian Amadon, BS, ACSM EP-C, Exercise Physiologist;Rebecca Sickles, PT   Supervising physician immediately available to respond to emergencies See telemetry face sheet for immediately available ER MD   Medication changes reported     No   Fall or balance concerns reported    No   Warm-up and Cool-down Performed on first and last piece of equipment   Resistance Training Performed No   VAD Patient? No   Pain Assessment   Currently in Pain? No/denies         Goals Met:  Proper associated with RPD/PD & O2 Sat Exercise tolerated well No report of cardiac concerns or symptoms Strength training completed today  Goals Unmet:  Not Applicable  Comments:    Dr. Emily Filbert is Medical Director for [place] [facility].

## 2015-03-30 DIAGNOSIS — Z9861 Coronary angioplasty status: Secondary | ICD-10-CM

## 2015-03-30 DIAGNOSIS — I214 Non-ST elevation (NSTEMI) myocardial infarction: Secondary | ICD-10-CM

## 2015-03-30 NOTE — Progress Notes (Signed)
Daily Session Note  Patient Details  Name: Sarah Bowen MRN: [id number] Date of Birth: 03-17-1965 Referring Provider:  Concha Pyo, MD  Encounter Date: 03/30/2015  Check In:     Session Check In - 03/30/15 1611    Check-In   Location [facility]   Staff Present Roanna Epley, RN, BSN;Carroll Enterkin, RN, BSN;Erin Obando, BS, ACSM EP-C, Exercise Physiologist   Supervising physician immediately available to respond to emergencies See telemetry face sheet for immediately available ER MD   Medication changes reported     No   Fall or balance concerns reported    No   Warm-up and Cool-down Performed on first and last piece of equipment   Resistance Training Performed No   VAD Patient? No   Pain Assessment   Currently in Pain? No/denies         Goals Met:  Proper associated with RPD/PD & O2 Sat Exercise tolerated well No report of cardiac concerns or symptoms Strength training completed today  Goals Unmet:  Not Applicable  Comments:    Dr. Emily Filbert is Medical Director for [place] [facility].

## 2015-04-03 DIAGNOSIS — I214 Non-ST elevation (NSTEMI) myocardial infarction: Secondary | ICD-10-CM

## 2015-04-03 DIAGNOSIS — Z9861 Coronary angioplasty status: Secondary | ICD-10-CM

## 2015-04-03 NOTE — Progress Notes (Signed)
Daily Session Note  Patient Details  Name: AMANPREET DELMONT MRN: [id number] Date of Birth: 09/18/1965 Referring Provider:  Concha Pyo, MD  Encounter Date: 04/03/2015  Check In:     Session Check In - 04/03/15 1630    Check-In   Location [facility]   Staff Present Heath Lark, RN, BSN, CCRP;Carroll Enterkin, RN, [employer], BS, ACSM EP-C, Exercise Physiologist   Supervising physician immediately available to respond to emergencies See telemetry face sheet for immediately available ER MD   Medication changes reported     No   Fall or balance concerns reported    No   Warm-up and Cool-down Performed on first and last piece of equipment   Resistance Training Performed No   VAD Patient? No   Pain Assessment   Currently in Pain? No/denies         Goals Met:  Proper associated with RPD/PD & O2 Sat Exercise tolerated well No report of cardiac concerns or symptoms Strength training completed today  Goals Unmet:  Not Applicable  Comments:    Dr. Emily Filbert is Medical Director for [place] [facility].

## 2015-04-10 DIAGNOSIS — I214 Non-ST elevation (NSTEMI) myocardial infarction: Secondary | ICD-10-CM | POA: Diagnosis not present

## 2015-04-10 DIAGNOSIS — Z9861 Coronary angioplasty status: Secondary | ICD-10-CM

## 2015-04-10 NOTE — Progress Notes (Signed)
Daily Session Note  Patient Details  Name: Sarah Bowen MRN: [id number] Date of Birth: 1965/10/17 Referring Provider:  Concha Pyo, MD  Encounter Date: 04/10/2015  Check In:     Session Check In - 04/10/15 1613    Check-In   Location [facility]   Staff Present Heath Lark, RN, BSN, CCRP;Ashlye Oviedo, BS, ACSM EP-C, Exercise Physiologist;Carroll Enterkin, RN, BSN   Supervising physician immediately available to respond to emergencies See telemetry face sheet for immediately available ER MD   Medication changes reported     No   Fall or balance concerns reported    No   Warm-up and Cool-down Performed on first and last piece of equipment   Resistance Training Performed No   VAD Patient? No   Pain Assessment   Currently in Pain? No/denies         Goals Met:  Proper associated with RPD/PD & O2 Sat Exercise tolerated well No report of cardiac concerns or symptoms Strength training completed today  Goals Unmet:  Not Applicable  Comments:    Dr. Emily Filbert is Medical Director for [place] [facility].

## 2015-04-12 ENCOUNTER — Encounter: Payer: BC Managed Care – PPO | Admitting: *Deleted

## 2015-04-12 DIAGNOSIS — I214 Non-ST elevation (NSTEMI) myocardial infarction: Secondary | ICD-10-CM

## 2015-04-12 NOTE — Progress Notes (Signed)
Daily Session Note  Patient Details  Name: Sarah Bowen MRN: [id number] Date of Birth: August 07, 1965 Referring Provider:  Concha Pyo, MD  Encounter Date: 04/12/2015  Check In:     Session Check In - 04/12/15 1629    Check-In   Location [facility]   Staff Present Roanna Epley, RN, Drusilla Kanner, MS, ACSM CEP, Exercise Physiologist;Carroll Guy Begin, RN, BSN   Supervising physician immediately available to respond to emergencies See telemetry face sheet for immediately available ER MD   Medication changes reported     No   Fall or balance concerns reported    No   Warm-up and Cool-down Performed on first and last piece of equipment   Resistance Training Performed Yes   VAD Patient? No   Pain Assessment   Currently in Pain? No/denies   Multiple Pain Sites No         Goals Met:  Independence with exercise equipment Exercise tolerated well No report of cardiac concerns or symptoms Strength training completed today  Goals Unmet:  Not Applicable  Comments: Patient completed exercise prescription and all exercise goals during rehab session. The exercise was tolerated well and the patient is progressing in the program.    Dr. Emily Filbert is Medical Director for [place] [facility].

## 2015-04-13 ENCOUNTER — Encounter: Payer: Self-pay | Admitting: *Deleted

## 2015-04-13 DIAGNOSIS — Z9861 Coronary angioplasty status: Secondary | ICD-10-CM

## 2015-04-13 DIAGNOSIS — I214 Non-ST elevation (NSTEMI) myocardial infarction: Secondary | ICD-10-CM

## 2015-04-13 NOTE — Progress Notes (Signed)
Cardiac Individual Treatment Plan  Patient Details  Name: Sarah Bowen MRN: [id number] Date of Birth: [birth year] Referring Provider:  Concha Pyo, MD  Initial Encounter Date:       Cardiac Rehab from 02/08/2015 in [facility] Cardiac and Pulmonary Rehab   Date  02/08/15      Visit Diagnosis: NSTEMI (non-ST elevated myocardial infarction) ([place])  S/P PTCA (percutaneous transluminal coronary angioplasty)  Patient's Home Medications on Admission:  Current outpatient prescriptions:  .  amLODipine (NORVASC) 5 MG tablet, Take 5 mg by mouth every morning., Disp: , Rfl:  .  aspirin EC 81 MG tablet, Take 81 mg by mouth every morning., Disp: , Rfl:  .  atorvastatin (LIPITOR) 80 MG tablet, Take 80 mg by mouth at bedtime., Disp: , Rfl:  .  EPINEPHrine 0.3 mg/0.3 mL IJ SOAJ injection, Inject 0.3 mg into the muscle Once PRN., Disp: , Rfl:  .  lisinopril (PRINIVIL,ZESTRIL) 20 MG tablet, Take 20 mg by mouth daily., Disp: , Rfl:  .  magnesium oxide (MAG-OX) 400 MG tablet, Take 400 mg by mouth daily., Disp: , Rfl:  .  metoprolol succinate (TOPROL-XL) 25 MG 24 hr tablet, Take 25 mg by mouth daily., Disp: , Rfl:  .  nitroGLYCERIN (NITROSTAT) 0.4 MG SL tablet, Place 0.4 mg under the tongue every 5 (five) minutes x 3 doses as needed. For Chest Pain, Disp: , Rfl:  .  ticagrelor (BRILINTA) 90 MG TABS tablet, Take 90 mg by mouth every 12 (twelve) hours., Disp: , Rfl:   Past Medical History: No past medical history on file.  Tobacco Use: History  Smoking status  . Never Smoker   Smokeless tobacco  . Never Used    Labs: Recent Review Flowsheet Data    There is no flowsheet data to display.       Exercise Target Goals:    Exercise Program Goal: Individual exercise prescription set with THRR, safety & activity barriers. Participant demonstrates ability to understand and report RPE using BORG scale, to self-measure pulse accurately, and to acknowledge the importance of the exercise  prescription.  Exercise Prescription Goal: Starting with aerobic activity 30 plus minutes a day, 3 days per week for initial exercise prescription. Provide home exercise prescription and guidelines that participant acknowledges understanding prior to discharge.  Activity Barriers & Risk Stratification:     Activity Barriers & Cardiac Risk Stratification - 02/08/15 1522    Activity Barriers & Cardiac Risk Stratification   Activity Barriers None   Cardiac Risk Stratification High      6 Minute Walk:     6 Minute Walk      02/08/15 1438       6 Minute Walk   Phase Initial     Distance 1135 feet     Walk Time 6 minutes     RPE 7     Symptoms No     Resting HR 68 bpm     Resting BP 110/70 mmHg     Max Ex. HR 117 bpm     Max Ex. BP 120/64 mmHg        Initial Exercise Prescription:     Initial Exercise Prescription - 02/08/15 1400    Date of Initial Exercise Prescription   Date 02/08/15   Treadmill   MPH 2   Grade 0   Minutes 15   Bike   Level 0.4   Watts 12   Minutes 10   Recumbant Bike   Level 3   RPM  40   Watts 25   Minutes 15   NuStep   Level 3   Watts 30   Minutes 15   Arm Ergometer   Level 1   Watts 8   Minutes 10   Arm/Foot Ergometer   Level 4   Watts 15   Minutes 10   Cybex   Level 3   RPM 50   Minutes 10   Recumbant Elliptical   Level 1   RPM 40   Watts 10   Minutes 10   Elliptical   Level 1   Speed 3   Minutes 1   REL-XR   Level 3   Watts 40   Minutes 15   T5 Nustep   Level 2   Watts 20   Minutes 15   Biostep-RELP   Level 3   Watts 25   Minutes 15   Prescription Details   Frequency (times per week) 3   Duration Progress to 30 minutes of continuous aerobic without signs/symptoms of physical distress   Intensity   THRR REST +  30   Ratings of Perceived Exertion 11-15   Progression   Progression Continue progressive overload as per policy without signs/symptoms or physical distress.   Resistance Training   Training  Prescription Yes   Weight 2   Reps 10-15      Perform Capillary Blood Glucose checks as needed.  Exercise Prescription Changes:     Exercise Prescription Changes      02/15/15 1600 02/16/15 1600 02/22/15 1600 02/27/15 1600 03/06/15 0743   Exercise Review   Progression   Yes  Yes   Response to Exercise   Blood Pressure (Admit)     124/76 mmHg   Blood Pressure (Exercise)     136/72 mmHg   Blood Pressure (Exit)     110/62 mmHg   Heart Rate (Admit)     66 bpm   Heart Rate (Exercise)     113 bpm   Heart Rate (Exit)     82 bpm   Rating of Perceived Exertion (Exercise)     12   Symptoms   None None None   Comments   Sarah Bowen can now exercise for the full time and exercise progression will now focus on increases in intensity  Sarah Bowen is steadily increasing in speed on the TM and in workload on the seated equipment. Once she has maxed out on her walking speed we will focus on increases in grade. We will introduce inteval training to [place] and see if she wants to pursue this type of training, Interval training will make further progression possible for Sarah Bowen as she has maxed out on the aerobic exercise and can do continuous exercise for 45-50 minutes with no issue.    Duration   Progress to 50 minutes of aerobic without signs/symptoms of physical distress Progress to 50 minutes of aerobic without signs/symptoms of physical distress Progress to 50 minutes of aerobic without signs/symptoms of physical distress   Intensity   Rest + 30 Rest + 30 Rest + 30   Progression   Progression   Continue progressive overload as per policy without signs/symptoms or physical distress. Continue progressive overload as per policy without signs/symptoms or physical distress. Continue progressive overload as per policy without signs/symptoms or physical distress.   Resistance Training   Training Prescription (read-only)   Yes Yes Yes   Weight (read-only)   _0 Reps (read-only)   10-15 10-15 10-15  Interval  Training   Interval Training     Yes   Equipment     Treadmill;Recumbant Elliptical  REL= BioStep   Treadmill   MPH (read-only)   _0 Grade (read-only)   0 0 0   Minutes (read-only)   _1 NuStep   Level (read-only) _2 Watts (read-only) _3 Minutes (read-only) _4 REL-XR   Level (read-only)   _5 Watts (read-only)   70 70 70   Minutes (read-only)   _6 Biostep-RELP   Level (read-only)  _7 Watts (read-only)  40 40 40 40   Minutes (read-only)  _8 03/15/15 1600 03/22/15 1600 03/30/15 1600 04/03/15 0629     Exercise Review   Progression Yes Yes Yes Yes    Response to Exercise   Blood Pressure (Admit) --   138/80 mmHg    Blood Pressure (Exercise) --   136/74 mmHg    Blood Pressure (Exit) --   110/62 mmHg    Heart Rate (Admit) --   83 bpm    Heart Rate (Exercise) --   113 bpm    Heart Rate (Exit) --   81 bpm    Rating of Perceived Exertion (Exercise) --   12    Symptoms --   None    Comments --   Reviewed individualized exercise prescription and made increases per departmental policy. Exercise increases were discussed with the patient and they were able to perform the new work loads without issue (no signs or symptoms). Sarah Bowen is maintaining interval training on the TM and her next goal is to maintain regular exercise at home. Home exercise will be discussed with her at her next visit and progress will be documented.     Duration Progress to 50 minutes of aerobic without signs/symptoms of physical distress Progress to 50 minutes of aerobic without signs/symptoms of physical distress Progress to 50 minutes of aerobic without signs/symptoms of physical distress Progress to 50 minutes of aerobic without signs/symptoms of physical distress    Intensity Rest + 30 Rest + 30 Rest + 30 Rest + 30    Progression   Progression Continue progressive overload as per policy without signs/symptoms or physical distress. Continue  progressive overload as per policy without signs/symptoms or physical distress. Continue progressive overload as per policy without signs/symptoms or physical distress. Continue progressive overload as per policy without signs/symptoms or physical distress.    Resistance Training   Training Prescription    Yes    Weight    3    Reps    10-15    Training Prescription (read-only) Yes       Weight (read-only) 3       Reps (read-only) 10-15       Interval Training   Interval Training Yes Yes Yes Yes    Equipment Treadmill;Recumbant Elliptical  REL= BioStep Treadmill;Recumbant Elliptical  REL= BioStep Treadmill;Recumbant Elliptical  REL= BioStep Treadmill;Recumbant Elliptical  REL= BioStep    Treadmill   MPH    2.6    Grade    3    Minutes    44    MPH (read-only) 2       Grade (read-only) 0       Minutes (read-only) 15  NuStep   Level (read-only) 3       Watts (read-only) 30       Minutes (read-only) 20       REL-XR   Level  _0 Watts  75 75 75    Minutes  _1 Level (read-only) 6       Watts (read-only) 70       Minutes (read-only) 15       Biostep-RELP   Level   5 5    Watts   60 60    Minutes   15 15    Level (read-only) 4       Watts (read-only) 40       Minutes (read-only) 25       Home Exercise Plan   Frequency    Add 2 additional days to program exercise sessions.       Exercise Comments:   Discharge Exercise Prescription (Final Exercise Prescription Changes):     Exercise Prescription Changes - 04/03/15 0629    Exercise Review   Progression Yes   Response to Exercise   Blood Pressure (Admit) 138/80 mmHg   Blood Pressure (Exercise) 136/74 mmHg   Blood Pressure (Exit) 110/62 mmHg   Heart Rate (Admit) 83 bpm   Heart Rate (Exercise) 113 bpm   Heart Rate (Exit) 81 bpm   Rating of Perceived Exertion (Exercise) 12   Symptoms None   Comments Reviewed individualized exercise prescription and made increases per departmental policy. Exercise  increases were discussed with the patient and they were able to perform the new work loads without issue (no signs or symptoms). Sarah Bowen is maintaining interval training on the TM and her next goal is to maintain regular exercise at home. Home exercise will be discussed with her at her next visit and progress will be documented.    Duration Progress to 50 minutes of aerobic without signs/symptoms of physical distress   Intensity Rest + 30   Progression   Progression Continue progressive overload as per policy without signs/symptoms or physical distress.   Resistance Training   Training Prescription Yes   Weight 3   Reps 10-15   Interval Training   Interval Training Yes   Equipment Treadmill;Recumbant Elliptical  REL= BioStep   Treadmill   MPH 2.6   Grade 3   Minutes 40   REL-XR   Level 4   Watts 75   Minutes 15   Biostep-RELP   Level 5   Watts 60   Minutes 15   Home Exercise Plan   Frequency Add 2 additional days to program exercise sessions.      Nutrition:  Target Goals: Understanding of nutrition guidelines, daily intake of sodium <1584m, cholesterol <2056m calories 30% from fat and 7% or less from saturated fats, daily to have 5 or more servings of fruits and vegetables.  Biometrics:     Pre Biometrics - 02/08/15 1438    Pre Biometrics   Height 5' 4.5" (1.638 m)   Weight 163 lb 14.4 oz (74.345 kg)   Waist Circumference 34 inches   Hip Circumference 40.5 inches   Waist to Hip Ratio 0.84 %   BMI (Calculated) 27.8       Nutrition Therapy Plan and Nutrition Goals:     Nutrition Therapy & Goals - 03/09/15 0935    Nutrition Therapy   Diet basic heart healthy diet, 1400kcal, working towards DASH guidelines   Drug/Food Interactions Statins/Certain Fruits  Protein (oz) (read-only) 6.5 ounces/day   Fiber 25 grams   Whole Grain Foods 3 servings   Saturated Fats 11 max. grams   Fruits and Vegetables 5 servings/day   Personal Nutrition Goals   Personal Goal #1  Increase water intake; gradually add 4-8oz per day    Personal Goal #2 Add some servings of vegetables and fruits -- plan to include at least one with each meal and/or snack; or monitor your daily servings   Comments Patient reports she is a picky eater, and diet changes are a challenge. Encouraged gradual, manageable changes, and 1-3 changes at a time. She will start with goals listed above.      Nutrition Discharge: Rate Your Plate Scores:     Nutrition Assessments - 03/09/15 0939    Rate Your Plate Scores   Pre Score 57   Pre Score % 63 %      Nutrition Goals Re-Evaluation:     Nutrition Goals Re-Evaluation      03/09/15 1622 03/30/15 1616         Personal Goal #1 Re-Evaluation   Personal Goal #1  Sarah Bowen reports she has increased her water intake.       Goal Progress Seen Yes Yes      Comments Sarah Bowen admitted today "i eat terrible but the dietician was encouraging to get me to eat healthier".        Personal Goal #2 Re-Evaluation   Personal Goal #2  Sarah Bowen said she is trying to increase her fruits and vegetables "but I am a picky eater like a child".       Goal Progress Seen  Yes         Psychosocial: Target Goals: Acknowledge presence or absence of depression, maximize coping skills, provide positive support system. Participant is able to verbalize types and ability to use techniques and skills needed for reducing stress and depression.  Initial Review & Psychosocial Screening:     Initial Psych Review & Screening - 02/08/15 1731    Family Dynamics   Comments Patient has a 59 year old daughter and a 21 year old son.  Her sister, Sarah Bowen, who works at [employer], is also supportive and is involved.  Patient scored a 10 on PHQ-9 due in part to problems associated with anoxic brain injury.  Pt is slow to respond and process information.  Pt. is undergoing speech therapy.     Screening Interventions   Interventions Encouraged to exercise;Program counselor consult       Quality of Life Scores:     Quality of Life - 02/08/15 1738    Quality of Life Scores   Health/Function Pre 13.96 %   Socioeconomic Pre 18.69 %   Psych/Spiritual Pre 28.29 %   Family Pre 25.5 %   GLOBAL Pre 19.72 %      PHQ-9:     Recent Review Flowsheet Data    Depression screen [facility] 2/9 02/08/2015   Decreased Interest 0   Down, Depressed, Hopeless 0   PHQ - 2 Score 0   Altered sleeping 0   Tired, decreased energy 3   Change in appetite 3   Feeling bad or failure about yourself  0   Trouble concentrating 1   Moving slowly or fidgety/restless 3   Suicidal thoughts 0   PHQ-9 Score 10   Difficult doing work/chores Somewhat difficult      Psychosocial Evaluation and Intervention:     Psychosocial Evaluation - 02/13/15 1739  Psychosocial Evaluation & Interventions   Interventions Encouraged to exercise with the program and follow exercise prescription   Comments Counselor met with Sarah Bowen today for initial psychosocial evaluation.  She is a 50 year old who went into cardiac arrest in October and was out of oxygen for quite some time, causing some cognitive problems currently.  She has a strong support system with older children living in her home and a sister close by.  She also is actively involved in her local church community.  Sarah Bowen states she sleeps well and although a "picky eater", her appetite is generally okay.  She denies a history of depression or anxiety or current symptoms.  She also states her mood is typically positive.  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  She also is unable to drive currently.  Her goals for this program are to increase her stamina and strength to be able to go up and down her stairs in her two story home with greater ease.  She plans to look into local resources, or a gym for maintenance upon  completion of this program.        Psychosocial Re-Evaluation:     Psychosocial Re-Evaluation      03/08/15 1707 03/09/15 1624         Psychosocial Re-Evaluation   Comments Follow up with Sarah Bowen reporting a positive report from her cardiologist and hoping to be taken off the "life vest" soon.  She states she feels increased stamina since coming into this program and hopes that her neurological condition will also improve in time so that she can return to a more normal life with work and driving again.  She continues to maintain a positive attitude.  Counselor will continue to follow with her.   Lachell said she has a good support system and feels her stress is undercontrol.          Vocational Rehabilitation: Provide vocational rehab assistance to qualifying candidates.   Vocational Rehab Evaluation & Intervention:     Vocational Rehab - 02/08/15 1523    Initial Vocational Rehab Evaluation & Intervention   Assessment shows need for Vocational Rehabilitation No      Education: Education Goals: Education classes will be provided on a weekly basis, covering required topics. Participant will state understanding/return demonstration of topics presented.  Learning Barriers/Preferences:     Learning Barriers/Preferences - 02/08/15 1523    Learning Barriers/Preferences   Learning Barriers None   Learning Preferences Written Material      Education Topics: General Nutrition Guidelines/Fats and Fiber: -Group instruction provided by verbal, written material, models and posters to present the general guidelines for heart healthy nutrition. Gives an explanation and review of dietary fats and fiber.          Cardiac Rehab from 04/12/2015 in [facility] Cardiac and Pulmonary Rehab   Date  02/27/15   Educator  PI   Instruction Review Code  2- meets goals/outcomes      Controlling Sodium/Reading Food Labels: -Group verbal and written material supporting the discussion of sodium use  in heart healthy nutrition. Review and explanation with models, verbal and written materials for utilization of the food label.      Cardiac Rehab from 04/12/2015 in [facility] Cardiac and Pulmonary Rehab   Date  03/06/15   Educator  PI   Instruction  Review Code  2- meets goals/outcomes      Exercise Physiology & Risk Factors: - Group verbal and written instruction with models to review the exercise physiology of the cardiovascular system and associated critical values. Details cardiovascular disease risk factors and the goals associated with each risk factor.      Cardiac Rehab from 04/12/2015 in [facility] Cardiac and Pulmonary Rehab   Date  03/20/15   Educator  sw   Instruction Review Code  2- meets goals/outcomes      Aerobic Exercise & Resistance Training: - Gives group verbal and written discussion on the health impact of inactivity. On the components of aerobic and resistive training programs and the benefits of this training and how to safely progress through these programs.      Cardiac Rehab from 04/12/2015 in [facility] Cardiac and Pulmonary Rehab   Date  03/22/15   Educator  BS   Instruction Review Code  2- meets goals/outcomes      Flexibility, Balance, General Exercise Guidelines: - Provides group verbal and written instruction on the benefits of flexibility and balance training programs. Provides general exercise guidelines with specific guidelines to those with heart or lung disease. Demonstration and skill practice provided.      Cardiac Rehab from 04/12/2015 in [facility] Cardiac and Pulmonary Rehab   Date  03/27/15   Educator  BS   Instruction Review Code  2- meets goals/outcomes      Stress Management: - Provides group verbal and written instruction about the health risks of elevated stress, cause of high stress, and healthy ways to reduce stress.   Depression: - Provides group verbal and written instruction on the correlation between heart/lung disease and depressed mood, treatment  options, and the stigmas associated with seeking treatment.      Cardiac Rehab from 04/12/2015 in [facility] Cardiac and Pulmonary Rehab   Date  03/15/15   Educator  Kathreen Cornfield, [facility]   Instruction Review Code  2- meets goals/outcomes      Anatomy & Physiology of the Heart: - Group verbal and written instruction and models provide basic cardiac anatomy and physiology, with the coronary electrical and arterial systems. Review of: AMI, Angina, Valve disease, Heart Failure, Cardiac Arrhythmia, Pacemakers, and the ICD.      Cardiac Rehab from 04/12/2015 in [facility] Cardiac and Pulmonary Rehab   Date  04/03/15   Educator  SB   Instruction Review Code  2- meets goals/outcomes      Cardiac Procedures: - Group verbal and written instruction and models to describe the testing methods done to diagnose heart disease. Reviews the outcomes of the test results. Describes the treatment choices: Medical Management, Angioplasty, or Coronary Bypass Surgery.      Cardiac Rehab from 04/12/2015 in [facility] Cardiac and Pulmonary Rehab   Date  04/10/15   Educator  SB   Instruction Review Code  2- meets goals/outcomes      Cardiac Medications: - Group verbal and written instruction to review commonly prescribed medications for heart disease. Reviews the medication, class of the drug, and side effects. Includes the steps to properly store meds and maintain the prescription regimen.      Cardiac Rehab from 04/12/2015 in [facility] Cardiac and Pulmonary Rehab   Date  04/12/15   Educator  DW   Instruction Review Code  2- meets goals/outcomes      Go Sex-Intimacy & Heart Disease, Get SMART - Goal Setting: - Group verbal and written instruction through game format to discuss heart  disease and the return to sexual intimacy. Provides group verbal and written material to discuss and apply goal setting through the application of the S.M.A.R.T. Method.      Cardiac Rehab from 04/12/2015 in [facility] Cardiac and Pulmonary Rehab   Date  04/10/15    Educator  SB   Instruction Review Code  2- meets goals/outcomes      Other Matters of the Heart: - Provides group verbal, written materials and models to describe Heart Failure, Angina, Valve Disease, and Diabetes in the realm of heart disease. Includes description of the disease process and treatment options available to the cardiac patient.      Cardiac Rehab from 04/12/2015 in [facility] Cardiac and Pulmonary Rehab   Date  02/20/15   Educator  SB   Instruction Review Code  2- meets goals/outcomes      Exercise & Equipment Safety: - Individual verbal instruction and demonstration of equipment use and safety with use of the equipment.      Cardiac Rehab from 04/12/2015 in [facility] Cardiac and Pulmonary Rehab   Date  02/08/15   Educator  D. Joya Gaskins, RN   Instruction Review Code  1- partially meets, needs review/practice      Infection Prevention: - Provides verbal and written material to individual with discussion of infection control including proper hand washing and proper equipment cleaning during exercise session.      Cardiac Rehab from 04/12/2015 in [facility] Cardiac and Pulmonary Rehab   Date  02/08/15   Educator  D. Joya Gaskins, RN   Instruction Review Code  2- meets goals/outcomes      Falls Prevention: - Provides verbal and written material to individual with discussion of falls prevention and safety.      Cardiac Rehab from 04/12/2015 in [facility] Cardiac and Pulmonary Rehab   Date  02/08/15   Educator  D. Joya Gaskins, RN   Instruction Review Code  2- meets goals/outcomes      Diabetes: - Individual verbal and written instruction to review signs/symptoms of diabetes, desired ranges of glucose level fasting, after meals and with exercise. Advice that pre and post exercise glucose checks will be done for 3 sessions at entry of program.    Knowledge Questionnaire Score:     Knowledge Questionnaire Score - 02/08/15 1523    Knowledge Questionnaire Score   Pre Score 22/28      Core  Components/Risk Factors/Patient Goals at Admission:     Personal Goals and Risk Factors at Admission - 02/08/15 1527    Core Components/Risk Factors/Patient Goals on Admission   Sedentary Yes   Intervention (read-only) While in program, learn and follow the exercise prescription taught. Start at a low level workload and increase workload after able to maintain previous level for 30 minutes. Increase time before increasing intensity.   Hypertension Yes   Goal Participant will see blood pressure controlled within the values of 140/63m/Hg or within value directed by their physician.   Intervention (read-only) Provide nutrition & aerobic exercise along with prescribed medications to achieve BP 140/90 or less.   Lipids Yes   Goal Cholesterol controlled with medications as prescribed, with individualized exercise RX and with personalized nutrition plan. Value goals: LDL < 766m HDL > 4088mParticipant states understanding of desired cholesterol values and following prescriptions.   Intervention (read-only) Provide nutrition & aerobic exercise along with prescribed medications to achieve LDL <68m68mDL >40mg74mStress Yes   Goal To meet with psychosocial counselor for stress and relaxation information and  guidance. To state understanding of performing relaxation techniques and or identifying personal stressors.   Intervention (read-only) Provide education on types of stress, identifiying stressors, and ways to cope with stress. Provide demonstration and active practice of relaxation techniques.   Personal Goal Other Yes   Personal Goal Patient is an Environmental consultant pincipal at elementary school.  Due to her cardiac arrest, NSTEMI, and PCI patient has not been able return to work.  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  She is seeing Sarah Bowen.  Patient would like to return to work.     Intervention Participate in  Cardiac Rehab to regain strength, endurance, and overall physical and mental health.     Take Less Medication Yes   Intervention Learn your risk factors and begin the lifestyle modifications for risk factor control during your time in the program.   Understand more about Heart/Pulmonary Disease. Yes   Intervention While in program utilize professionals for any questions, and attend the education sessions. Great websites to use are www.americanheart.org or www.lung.org for reliable information.      Core Components/Risk Factors/Patient Goals Review:      Goals and Risk Factor Review      02/13/15 1818 03/09/15 1622 03/30/15 1616 03/30/15 1619     Core Components/Risk Factors/Patient Goals Review   Personal Goals Review Increase Aerobic Exercise and Physical Activity;Hypertension;Lipids;Stress  Weight Management/Obesity;Hypertension;Lipids;Stress     Review   Sarah Bowen turned in her 76 holter monitor the other day and said she went to the MD today and he didn't say anything about it. Sarah Bowen said her MD said her last cholestrol blood work was ok so he didn't need to check it for 6 months. Systolic bp 355-732. Sarah Bowen reports MD changed her BP meds about a month ago.  Provide nutrition & aerobic exercise along with prescribed medications to achieve LDL <32m, HDL >46m    Expected Outcomes    Participant will see blood pressure controlled within the values of 140/9045mg or within value directed by their physician.Provide nutrition & aerobic exercise along with prescribed medications to achieve LDL <55m43mDL >40mg2meight is much better.     Increase Aerobic Exercise and Physical Activity (read-only)   Comments Spoke with Tammy today to review the exercise progam process and how the staff will work with her to slowly increase her workloads to help her build up her stamina.       Hypertension (read-only)   Goal Participant will see blood pressure controlled within the values of 140/90mm/87mr within  value directed by their physician.       Progress seen toward goals  Yes      Comments Reviewed the risk factor and the steps to help keep and maintain control of the risk factor: Medication compliance, Nutrition and the exercise program.  Tammy stated understanding of th einformation provided today. Toma's blood pressure is 130/-1202/-542lic /70   /70Abnormal Lipids (read-only)   Goal Cholesterol controlled with medications as prescribed, with individualized exercise RX and with personalized nutrition plan. Value goals: LDL < 55mg, 52m> 40mg. P72mcipant states understanding of desired cholesterol values and following prescriptions.       Progress seen towards goals  Unknown      Comments Reviewed the risk factor and the steps to help keep and maintain control of the risk factor: Medication compliance, Nutrition and the exercise program.  Tammy stated understanding of  th einformation provided today.       Stress (read-only)   Goal To meet with psychosocial counselor for stress and relaxation information and guidance. To state understanding of performing relaxation techniques and or identifying personal stressors.       Progress seen towards goals  Yes      Comments Met with psychosocial counselor today Sarah Bowen said she has a good support system and feels her stress is undercontrol.          Core Components/Risk Factors/Patient Goals at Discharge (Final Review):      Goals and Risk Factor Review - 03/30/15 1619    Core Components/Risk Factors/Patient Goals Review   Review Provide nutrition & aerobic exercise along with prescribed medications to achieve LDL <47m, HDL >412m   Expected Outcomes Participant will see blood pressure controlled within the values of 140/9010mg or within value directed by their physician.Provide nutrition & aerobic exercise along with prescribed medications to achieve LDL <63m43mDL >40mg75meight is much better.       ITP Comments:     ITP Comments       02/16/15 1323 03/02/15 1724 03/13/15 1707 03/14/15 0735 03/15/15 1748   ITP Comments Ready for 30 day review. Continue with ITP.  New to program has 2 visits completed since orietation TamarLynnet she is working with a Neuropsychologist who is having her do puzzles to increase her memory and function of her mind.  Tammy saw her physician Friday, does not have to wear her Lifevest any longer.   30 day review.  Continue with ITP Patient no longer wearing Life Vest.  Patient scheduled for Stress Test tomorrow.  Will not be here for Cardiac Rehab tomorrow.       03/30/15 1615 04/13/15 1536         ITP Comments TamarTomekaed in her 48 ho41er monitor the other day and said she went to the MD today and he didn't say anything about it.  30 Day Review. Continue with the ITP.         Comments:

## 2015-04-17 VITALS — Ht 64.5 in | Wt 171.0 lb

## 2015-04-17 DIAGNOSIS — I214 Non-ST elevation (NSTEMI) myocardial infarction: Secondary | ICD-10-CM

## 2015-04-17 DIAGNOSIS — Z9861 Coronary angioplasty status: Secondary | ICD-10-CM

## 2015-04-17 NOTE — Progress Notes (Signed)
Daily Session Note  Patient Details  Name: Sarah Bowen MRN: [id number] Date of Birth: [birth year] Referring Provider:  Concha Pyo, MD  Encounter Date: 04/17/2015  Check In:     Session Check In - 04/17/15 1607    Check-In   Location [facility]   Staff Present Heath Lark, RN, BSN, CCRP;Carroll Enterkin, RN, [employer], BS, ACSM EP-C, Exercise Physiologist   Supervising physician immediately available to respond to emergencies See telemetry face sheet for immediately available ER MD   Medication changes reported     No   Fall or balance concerns reported    No   Warm-up and Cool-down Performed on first and last piece of equipment   Resistance Training Performed No   VAD Patient? No   Pain Assessment   Currently in Pain? No/denies         Goals Met:  Proper associated with RPD/PD & O2 Sat Exercise tolerated well No report of cardiac concerns or symptoms Strength training completed today  Goals Unmet:  Not Applicable  Comments:    Dr. Emily Filbert is Medical Director for [facility].

## 2015-04-17 NOTE — Patient Instructions (Signed)
Discharge Instructions  Patient Details  Name: Sarah Bowen MRN: [id number] Date of Birth: [birth year] Referring Provider:  Raynelle Jan, MD   Number of Visits:   Reason for Discharge:  Patient reached a stable level of exercise. Patient independent in their exercise.  Smoking History:  History  Smoking status  . Never Smoker   Smokeless tobacco  . Never Used    Diagnosis:  NSTEMI (non-ST elevated myocardial infarction) (HCC)  S/P PTCA (percutaneous transluminal coronary angioplasty)  Initial Exercise Prescription:     Initial Exercise Prescription - 02/08/15 1400    Date of Initial Exercise Prescription   Date 02/08/15   Treadmill   MPH 2   Grade 0   Minutes 15   Bike   Level 0.4   Watts 12   Minutes 10   Recumbant Bike   Level 3   RPM 40   Watts 25   Minutes 15   NuStep   Level 3   Watts 30   Minutes 15   Arm Ergometer   Level 1   Watts 8   Minutes 10   Arm/Foot Ergometer   Level 4   Watts 15   Minutes 10   Cybex   Level 3   RPM 50   Minutes 10   Recumbant Elliptical   Level 1   RPM 40   Watts 10   Minutes 10   Elliptical   Level 1   Speed 3   Minutes 1   REL-XR   Level 3   Watts 40   Minutes 15   T5 Nustep   Level 2   Watts 20   Minutes 15   Biostep-RELP   Level 3   Watts 25   Minutes 15   Prescription Details   Frequency (times per week) 3   Duration Progress to 30 minutes of continuous aerobic without signs/symptoms of physical distress   Intensity   THRR REST +  30   Ratings of Perceived Exertion 11-15   Progression   Progression Continue progressive overload as per policy without signs/symptoms or physical distress.   Resistance Training   Training Prescription Yes   Weight 2   Reps 10-15      Discharge Exercise Prescription (Final Exercise Prescription Changes):     Exercise Prescription Changes - 04/03/15 0629    Exercise Review   Progression Yes   Response to Exercise   Blood Pressure (Admit) 138/80  mmHg   Blood Pressure (Exercise) 136/74 mmHg   Blood Pressure (Exit) 110/62 mmHg   Heart Rate (Admit) 83 bpm   Heart Rate (Exercise) 113 bpm   Heart Rate (Exit) 81 bpm   Rating of Perceived Exertion (Exercise) 12   Symptoms None   Comments Reviewed individualized exercise prescription and made increases per departmental policy. Exercise increases were discussed with the patient and they were able to perform the new work loads without issue (no signs or symptoms). Sarah Bowen is maintaining interval training on the TM and her next goal is to maintain regular exercise at home. Home exercise will be discussed with her at her next visit and progress will be documented.    Duration Progress to 50 minutes of aerobic without signs/symptoms of physical distress   Intensity Rest + 30   Progression   Progression Continue progressive overload as per policy without signs/symptoms or physical distress.   Resistance Training   Training Prescription Yes   Weight 3   Reps 10-15   Interval Training  Interval Training Yes   Equipment Treadmill;Recumbant Elliptical  REL= BioStep   Treadmill   MPH 2.6   Grade 3   Minutes 40   REL-XR   Level 4   Watts 75   Minutes 15   Biostep-RELP   Level 5   Watts 60   Minutes 15   Home Exercise Plan   Frequency Add 2 additional days to program exercise sessions.      Functional Capacity:     6 Minute Walk      02/08/15 1438 04/17/15 1656     6 Minute Walk   Phase Initial Discharge    Distance 1135 feet 1600 feet    Walk Time 6 minutes 6 minutes    RPE 7 11    Symptoms No No    Resting HR 68 bpm 53 bpm    Resting BP 110/70 mmHg 124/74 mmHg    Max Ex. HR 117 bpm 103 bpm    Max Ex. BP 120/64 mmHg 144/74 mmHg       Quality of Life:     Quality of Life - 02/08/15 1738    Quality of Life Scores   Health/Function Pre 13.96 %   Socioeconomic Pre 18.69 %   Psych/Spiritual Pre 28.29 %   Family Pre 25.5 %   GLOBAL Pre 19.72 %      Personal  Goals: Goals established at orientation with interventions provided to work toward goal.     Personal Goals and Risk Factors at Admission - 02/08/15 1527    Core Components/Risk Factors/Patient Goals on Admission   Sedentary Yes   Intervention (read-only) While in program, learn and follow the exercise prescription taught. Start at a low level workload and increase workload after able to maintain previous level for 30 minutes. Increase time before increasing intensity.   Hypertension Yes   Goal Participant will see blood pressure controlled within the values of 140/94mm/Hg or within value directed by their physician.   Intervention (read-only) Provide nutrition & aerobic exercise along with prescribed medications to achieve BP 140/90 or less.   Lipids Yes   Goal Cholesterol controlled with medications as prescribed, with individualized exercise RX and with personalized nutrition plan. Value goals: LDL < 70mg , HDL > 40mg . Participant states understanding of desired cholesterol values and following prescriptions.   Intervention (read-only) Provide nutrition & aerobic exercise along with prescribed medications to achieve LDL 70mg , HDL >40mg .   Stress Yes   Goal To meet with psychosocial counselor for stress and relaxation information and guidance. To state understanding of performing relaxation techniques and or identifying personal stressors.   Intervention (read-only) Provide education on types of stress, identifiying stressors, and ways to cope with stress. Provide demonstration and active practice of relaxation techniques.   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  Due to her cardiac arrest, NSTEMI, and PCI patient has not been able return to work.  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  She is seeing Human resources officer.  Patient would like to return to  work.     Intervention Participate in Cardiac Rehab to regain strength, endurance, and overall physical and mental health.     Take Less Medication Yes   Intervention Learn your risk factors and begin the lifestyle modifications for risk factor control during your time in the program.   Understand more about Heart/Pulmonary Disease. Yes  Intervention While in program utilize professionals for any questions, and attend the education sessions. Great websites to use are www.americanheart.org or www.lung.org for reliable information.       Personal Goals Discharge:     Goals and Risk Factor Review - 03/30/15 1619    Core Components/Risk Factors/Patient Goals Review   Review Provide nutrition & aerobic exercise along with prescribed medications to achieve LDL 70mg , HDL >40mg .   Expected Outcomes Participant will see blood pressure controlled within the values of 140/75mm/Hg or within value directed by their physician.Provide nutrition & aerobic exercise along with prescribed medications to achieve LDL 70mg , HDL >40mg .. Weight is much better.       Nutrition & Weight - Outcomes:     Pre Biometrics - 02/08/15 1438    Pre Biometrics   Height 5' 4.5" (1.638 m)   Weight 163 lb 14.4 oz (74.345 kg)   Waist Circumference 34 inches   Hip Circumference 40.5 inches   Waist to Hip Ratio 0.84 %   BMI (Calculated) 27.8         Post Biometrics - 04/17/15 1657     Post  Biometrics   Height 5' 4.5" (1.638 m)   Weight 171 lb (77.565 kg)   Waist Circumference 32 inches   Hip Circumference 41.5 inches   Waist to Hip Ratio 0.77 %   BMI (Calculated) 29      Nutrition:     Nutrition Therapy & Goals - 03/09/15 0935    Nutrition Therapy   Diet basic heart healthy diet, 1400kcal, working towards DASH guidelines   Drug/Food Interactions Statins/Certain Fruits   Protein (oz) (read-only) 6.5 ounces/day   Fiber 25 grams   Whole Grain Foods 3 servings   Saturated Fats 11 max. grams   Fruits  and Vegetables 5 servings/day   Personal Nutrition Goals   Personal Goal #1 Increase water intake; gradually add 4-8oz per day    Personal Goal #2 Add some servings of vegetables and fruits -- plan to include at least one with each meal and/or snack; or monitor your daily servings   Comments Patient reports she is a picky eater, and diet changes are a challenge. Encouraged gradual, manageable changes, and 1-3 changes at a time. She will start with goals listed above.      Nutrition Discharge:     Nutrition Assessments - 03/09/15 0939    Rate Your Plate Scores   Pre Score 57   Pre Score % 63 %      Education Questionnaire Score:     Knowledge Questionnaire Score - 02/08/15 1523    Knowledge Questionnaire Score   Pre Score 22/28      Goals reviewed with patient; copy given to patient.

## 2015-04-17 NOTE — Progress Notes (Signed)
Cardiac Individual Treatment Plan  Patient Details  Name: Sarah Bowen MRN: [id number] Date of Birth: [birth year] Referring Provider:  Concha Pyo, MD  Initial Encounter Date:       Cardiac Rehab from 02/08/2015 in [facility] Cardiac and Pulmonary Rehab   Date  02/08/15      Visit Diagnosis: NSTEMI (non-ST elevated myocardial infarction) ([place])  S/P PTCA (percutaneous transluminal coronary angioplasty)  Patient's Home Medications on Admission:  Current outpatient prescriptions:  .  amLODipine (NORVASC) 5 MG tablet, Take 5 mg by mouth every morning., Disp: , Rfl:  .  aspirin EC 81 MG tablet, Take 81 mg by mouth every morning., Disp: , Rfl:  .  atorvastatin (LIPITOR) 80 MG tablet, Take 80 mg by mouth at bedtime., Disp: , Rfl:  .  EPINEPHrine 0.3 mg/0.3 mL IJ SOAJ injection, Inject 0.3 mg into the muscle Once PRN., Disp: , Rfl:  .  lisinopril (PRINIVIL,ZESTRIL) 20 MG tablet, Take 20 mg by mouth daily., Disp: , Rfl:  .  magnesium oxide (MAG-OX) 400 MG tablet, Take 400 mg by mouth daily., Disp: , Rfl:  .  metoprolol succinate (TOPROL-XL) 25 MG 24 hr tablet, Take 25 mg by mouth daily., Disp: , Rfl:  .  nitroGLYCERIN (NITROSTAT) 0.4 MG SL tablet, Place 0.4 mg under the tongue every 5 (five) minutes x 3 doses as needed. For Chest Pain, Disp: , Rfl:  .  ticagrelor (BRILINTA) 90 MG TABS tablet, Take 90 mg by mouth every 12 (twelve) hours., Disp: , Rfl:   Past Medical History: No past medical history on file.  Tobacco Use: History  Smoking status  . Never Smoker   Smokeless tobacco  . Never Used    Labs: Recent Review Flowsheet Data    There is no flowsheet data to display.       Exercise Target Goals:    Exercise Program Goal: Individual exercise prescription set with THRR, safety & activity barriers. Participant demonstrates ability to understand and report RPE using BORG scale, to self-measure pulse accurately, and to acknowledge the importance of the exercise  prescription.  Exercise Prescription Goal: Starting with aerobic activity 30 plus minutes a day, 3 days per week for initial exercise prescription. Provide home exercise prescription and guidelines that participant acknowledges understanding prior to discharge.  Activity Barriers & Risk Stratification:     Activity Barriers & Cardiac Risk Stratification - 02/08/15 1522    Activity Barriers & Cardiac Risk Stratification   Activity Barriers None   Cardiac Risk Stratification High      6 Minute Walk:     6 Minute Walk      02/08/15 1438 04/17/15 1656     6 Minute Walk   Phase Initial Discharge    Distance 1135 feet 1600 feet    Walk Time 6 minutes 6 minutes    RPE 7 11    Symptoms No No    Resting HR 68 bpm 53 bpm    Resting BP 110/70 mmHg 124/74 mmHg    Max Ex. HR 117 bpm 103 bpm    Max Ex. BP 120/64 mmHg 144/74 mmHg       Initial Exercise Prescription:     Initial Exercise Prescription - 02/08/15 1400    Date of Initial Exercise Prescription   Date 02/08/15   Treadmill   MPH 2   Grade 0   Minutes 15   Bike   Level 0.4   Watts 12   Minutes 10   Recumbant Bike  Level 3   RPM 40   Watts 25   Minutes 15   NuStep   Level 3   Watts 30   Minutes 15   Arm Ergometer   Level 1   Watts 8   Minutes 10   Arm/Foot Ergometer   Level 4   Watts 15   Minutes 10   Cybex   Level 3   RPM 50   Minutes 10   Recumbant Elliptical   Level 1   RPM 40   Watts 10   Minutes 10   Elliptical   Level 1   Speed 3   Minutes 1   REL-XR   Level 3   Watts 40   Minutes 15   T5 Nustep   Level 2   Watts 20   Minutes 15   Biostep-RELP   Level 3   Watts 25   Minutes 15   Prescription Details   Frequency (times per week) 3   Duration Progress to 30 minutes of continuous aerobic without signs/symptoms of physical distress   Intensity   THRR REST +  30   Ratings of Perceived Exertion 11-15   Progression   Progression Continue progressive overload as per policy  without signs/symptoms or physical distress.   Resistance Training   Training Prescription Yes   Weight 2   Reps 10-15      Perform Capillary Blood Glucose checks as needed.  Exercise Prescription Changes:     Exercise Prescription Changes      02/15/15 1600 02/16/15 1600 02/22/15 1600 02/27/15 1600 03/06/15 0743   Exercise Review   Progression   Yes  Yes   Response to Exercise   Blood Pressure (Admit)     124/76 mmHg   Blood Pressure (Exercise)     136/72 mmHg   Blood Pressure (Exit)     110/62 mmHg   Heart Rate (Admit)     66 bpm   Heart Rate (Exercise)     113 bpm   Heart Rate (Exit)     82 bpm   Rating of Perceived Exertion (Exercise)     12   Symptoms   None None None   Comments   Sarah Bowen can now exercise for the full time and exercise progression will now focus on increases in intensity  Sarah Bowen is steadily increasing in speed on the TM and in workload on the seated equipment. Once she has maxed out on her walking speed we will focus on increases in grade. We will introduce inteval training to [place] and see if she wants to pursue this type of training, Interval training will make further progression possible for Sarah Bowen as she has maxed out on the aerobic exercise and can do continuous exercise for 45-50 minutes with no issue.    Duration   Progress to 50 minutes of aerobic without signs/symptoms of physical distress Progress to 50 minutes of aerobic without signs/symptoms of physical distress Progress to 50 minutes of aerobic without signs/symptoms of physical distress   Intensity   Rest + 30 Rest + 30 Rest + 30   Progression   Progression   Continue progressive overload as per policy without signs/symptoms or physical distress. Continue progressive overload as per policy without signs/symptoms or physical distress. Continue progressive overload as per policy without signs/symptoms or physical distress.   Resistance Training   Training Prescription (read-only)   Yes Yes Yes   Weight  (read-only)   _0 Reps (  read-only)   10-15 10-15 10-15   Interval Training   Interval Training     Yes   Equipment     Treadmill;Recumbant Elliptical  REL= BioStep   Treadmill   MPH (read-only)   _0 Grade (read-only)   0 0 0   Minutes (read-only)   _1 NuStep   Level (read-only) _2 Watts (read-only) _3 Minutes (read-only) _4 REL-XR   Level (read-only)   _5 Watts (read-only)   70 70 70   Minutes (read-only)   _6 Biostep-RELP   Level (read-only)  _7 Watts (read-only)  40 40 40 40   Minutes (read-only)  _8 03/15/15 1600 03/22/15 1600 03/30/15 1600 04/03/15 0629     Exercise Review   Progression Yes Yes Yes Yes    Response to Exercise   Blood Pressure (Admit) --   138/80 mmHg    Blood Pressure (Exercise) --   136/74 mmHg    Blood Pressure (Exit) --   110/62 mmHg    Heart Rate (Admit) --   83 bpm    Heart Rate (Exercise) --   113 bpm    Heart Rate (Exit) --   81 bpm    Rating of Perceived Exertion (Exercise) --   12    Symptoms --   None    Comments --   Reviewed individualized exercise prescription and made increases per departmental policy. Exercise increases were discussed with the patient and they were able to perform the new work loads without issue (no signs or symptoms). Sarah Bowen is maintaining interval training on the TM and her next goal is to maintain regular exercise at home. Home exercise will be discussed with her at her next visit and progress will be documented.     Duration Progress to 50 minutes of aerobic without signs/symptoms of physical distress Progress to 50 minutes of aerobic without signs/symptoms of physical distress Progress to 50 minutes of aerobic without signs/symptoms of physical distress Progress to 50 minutes of aerobic without signs/symptoms of physical distress    Intensity Rest + 30 Rest + 30 Rest + 30 Rest + 30    Progression   Progression Continue progressive  overload as per policy without signs/symptoms or physical distress. Continue progressive overload as per policy without signs/symptoms or physical distress. Continue progressive overload as per policy without signs/symptoms or physical distress. Continue progressive overload as per policy without signs/symptoms or physical distress.    Resistance Training   Training Prescription    Yes    Weight    3    Reps    10-15    Training Prescription (read-only) Yes       Weight (read-only) 3       Reps (read-only) 10-15       Interval Training   Interval Training Yes Yes Yes Yes    Equipment Treadmill;Recumbant Elliptical  REL= BioStep Treadmill;Recumbant Elliptical  REL= BioStep Treadmill;Recumbant Elliptical  REL= BioStep Treadmill;Recumbant Elliptical  REL= BioStep    Treadmill   MPH    2.6    Grade    3    Minutes    12    MPH (read-only) 2       Grade (read-only) 0  Minutes (read-only) 15       NuStep   Level (read-only) 3       Watts (read-only) 30       Minutes (read-only) 20       REL-XR   Level  _0 Watts  75 75 75    Minutes  _1 Level (read-only) 6       Watts (read-only) 70       Minutes (read-only) 15       Biostep-RELP   Level   5 5    Watts   60 60    Minutes   15 15    Level (read-only) 4       Watts (read-only) 40       Minutes (read-only) 25       Home Exercise Plan   Frequency    Add 2 additional days to program exercise sessions.       Exercise Comments:   Discharge Exercise Prescription (Final Exercise Prescription Changes):     Exercise Prescription Changes - 04/03/15 0629    Exercise Review   Progression Yes   Response to Exercise   Blood Pressure (Admit) 138/80 mmHg   Blood Pressure (Exercise) 136/74 mmHg   Blood Pressure (Exit) 110/62 mmHg   Heart Rate (Admit) 83 bpm   Heart Rate (Exercise) 113 bpm   Heart Rate (Exit) 81 bpm   Rating of Perceived Exertion (Exercise) 12   Symptoms None   Comments Reviewed individualized  exercise prescription and made increases per departmental policy. Exercise increases were discussed with the patient and they were able to perform the new work loads without issue (no signs or symptoms). Sarah Bowen is maintaining interval training on the TM and her next goal is to maintain regular exercise at home. Home exercise will be discussed with her at her next visit and progress will be documented.    Duration Progress to 50 minutes of aerobic without signs/symptoms of physical distress   Intensity Rest + 30   Progression   Progression Continue progressive overload as per policy without signs/symptoms or physical distress.   Resistance Training   Training Prescription Yes   Weight 3   Reps 10-15   Interval Training   Interval Training Yes   Equipment Treadmill;Recumbant Elliptical  REL= BioStep   Treadmill   MPH 2.6   Grade 3   Minutes 40   REL-XR   Level 4   Watts 75   Minutes 15   Biostep-RELP   Level 5   Watts 60   Minutes 15   Home Exercise Plan   Frequency Add 2 additional days to program exercise sessions.      Nutrition:  Target Goals: Understanding of nutrition guidelines, daily intake of sodium <152m, cholesterol <2038m calories 30% from fat and 7% or less from saturated fats, daily to have 5 or more servings of fruits and vegetables.  Biometrics:     Pre Biometrics - 02/08/15 1438    Pre Biometrics   Height 5' 4.5" (1.638 m)   Weight 163 lb 14.4 oz (74.345 kg)   Waist Circumference 34 inches   Hip Circumference 40.5 inches   Waist to Hip Ratio 0.84 %   BMI (Calculated) 27.8         Post Biometrics - 04/17/15 1657     Post  Biometrics   Height 5' 4.5" (1.638 m)   Weight 171 lb (77.565 kg)   Waist  Circumference 32 inches   Hip Circumference 41.5 inches   Waist to Hip Ratio 0.77 %   BMI (Calculated) 29      Nutrition Therapy Plan and Nutrition Goals:     Nutrition Therapy & Goals - 03/09/15 0935    Nutrition Therapy   Diet basic heart  healthy diet, 1400kcal, working towards DASH guidelines   Drug/Food Interactions Statins/Certain Fruits   Protein (oz) (read-only) 6.5 ounces/day   Fiber 25 grams   Whole Grain Foods 3 servings   Saturated Fats 11 max. grams   Fruits and Vegetables 5 servings/day   Personal Nutrition Goals   Personal Goal #1 Increase water intake; gradually add 4-8oz per day    Personal Goal #2 Add some servings of vegetables and fruits -- plan to include at least one with each meal and/or snack; or monitor your daily servings   Comments Patient reports she is a picky eater, and diet changes are a challenge. Encouraged gradual, manageable changes, and 1-3 changes at a time. She will start with goals listed above.      Nutrition Discharge: Rate Your Plate Scores:     Nutrition Assessments - 03/09/15 0939    Rate Your Plate Scores   Pre Score 57   Pre Score % 63 %      Nutrition Goals Re-Evaluation:     Nutrition Goals Re-Evaluation      03/09/15 1622 03/30/15 1616         Personal Goal #1 Re-Evaluation   Personal Goal #1  Lenya reports she has increased her water intake.       Goal Progress Seen Yes Yes      Comments Catha admitted today "i eat terrible but the dietician was encouraging to get me to eat healthier".        Personal Goal #2 Re-Evaluation   Personal Goal #2  Rael said she is trying to increase her fruits and vegetables "but I am a picky eater like a child".       Goal Progress Seen  Yes         Psychosocial: Target Goals: Acknowledge presence or absence of depression, maximize coping skills, provide positive support system. Participant is able to verbalize types and ability to use techniques and skills needed for reducing stress and depression.  Initial Review & Psychosocial Screening:     Initial Psych Review & Screening - 02/08/15 1731    Family Dynamics   Comments Patient has a 23 year old daughter and a 67 year old son.  Her sister, Dereck Ligas, who works at  [employer], is also supportive and is involved.  Patient scored a 10 on PHQ-9 due in part to problems associated with anoxic brain injury.  Pt is slow to respond and process information.  Pt. is undergoing speech therapy.     Screening Interventions   Interventions Encouraged to exercise;Program counselor consult      Quality of Life Scores:     Quality of Life - 02/08/15 1738    Quality of Life Scores   Health/Function Pre 13.96 %   Socioeconomic Pre 18.69 %   Psych/Spiritual Pre 28.29 %   Family Pre 25.5 %   GLOBAL Pre 19.72 %      PHQ-9:     Recent Review Flowsheet Data    Depression screen [facility] 2/9 02/08/2015   Decreased Interest 0   Down, Depressed, Hopeless 0   PHQ - 2 Score 0   Altered sleeping 0  Tired, decreased energy 3   Change in appetite 3   Feeling bad or failure about yourself  0   Trouble concentrating 1   Moving slowly or fidgety/restless 3   Suicidal thoughts 0   PHQ-9 Score 10   Difficult doing work/chores Somewhat difficult      Psychosocial Evaluation and Intervention:     Psychosocial Evaluation - 02/13/15 1739    Psychosocial Evaluation & Interventions   Interventions Encouraged to exercise with the program and follow exercise prescription   Comments Counselor met with Ms. Amiri today for initial psychosocial evaluation.  She is a 50 year old who went into cardiac arrest in October and was out of oxygen for quite some time, causing some cognitive problems currently.  She has a strong support system with older children living in her home and a sister close by.  She also is actively involved in her local church community.  Ms. Ebbert states she sleeps well and although a "picky eater", her appetite is generally okay.  She denies a history of depression or anxiety or current symptoms.  She also states her mood is typically positive.  Ms. 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  She also is unable to drive currently.  Her goals for this program are to increase her stamina and strength to be able to go up and down her stairs in her two story home with greater ease.  She plans to look into local resources, or a gym for maintenance upon completion of this program.        Psychosocial Re-Evaluation:     Psychosocial Re-Evaluation      03/08/15 1707 03/09/15 1624         Psychosocial Re-Evaluation   Comments Follow up with Ms. Cotterman reporting a positive report from her cardiologist and hoping to be taken off the "life vest" soon.  She states she feels increased stamina since coming into this program and hopes that her neurological condition will also improve in time so that she can return to a more normal life with work and driving again.  She continues to maintain a positive attitude.  Counselor will continue to follow with her.   Shawndrea said she has a good support system and feels her stress is undercontrol.          Vocational Rehabilitation: Provide vocational rehab assistance to qualifying candidates.   Vocational Rehab Evaluation & Intervention:     Vocational Rehab - 02/08/15 1523    Initial Vocational Rehab Evaluation & Intervention   Assessment shows need for Vocational Rehabilitation No      Education: Education Goals: Education classes will be provided on a weekly basis, covering required topics. Participant will state understanding/return demonstration of topics presented.  Learning Barriers/Preferences:     Learning Barriers/Preferences - 02/08/15 1523    Learning Barriers/Preferences   Learning Barriers None   Learning Preferences Written Material      Education Topics: General Nutrition Guidelines/Fats and Fiber: -Group instruction provided by verbal, written material, models and posters to present the general guidelines for heart healthy nutrition. Gives an explanation and  review of dietary fats and fiber.          Cardiac Rehab from 04/17/2015 in [facility] Cardiac and Pulmonary Rehab   Date  02/27/15   Educator  PI   Instruction Review Code  2- meets goals/outcomes      Controlling Sodium/Reading Food Labels: -Group verbal and written material supporting the discussion of sodium use in heart healthy nutrition. Review and explanation with models, verbal and written materials for utilization of the food label.      Cardiac Rehab from 04/17/2015 in [facility] Cardiac and Pulmonary Rehab   Date  03/06/15   Educator  PI   Instruction Review Code  2- meets goals/outcomes      Exercise Physiology & Risk Factors: - Group verbal and written instruction with models to review the exercise physiology of the cardiovascular system and associated critical values. Details cardiovascular disease risk factors and the goals associated with each risk factor.      Cardiac Rehab from 04/17/2015 in [facility] Cardiac and Pulmonary Rehab   Date  03/20/15   Educator  sw   Instruction Review Code  2- meets goals/outcomes      Aerobic Exercise & Resistance Training: - Gives group verbal and written discussion on the health impact of inactivity. On the components of aerobic and resistive training programs and the benefits of this training and how to safely progress through these programs.      Cardiac Rehab from 04/17/2015 in [facility] Cardiac and Pulmonary Rehab   Date  03/22/15   Educator  BS   Instruction Review Code  2- meets goals/outcomes      Flexibility, Balance, General Exercise Guidelines: - Provides group verbal and written instruction on the benefits of flexibility and balance training programs. Provides general exercise guidelines with specific guidelines to those with heart or lung disease. Demonstration and skill practice provided.      Cardiac Rehab from 04/17/2015 in [facility] Cardiac and Pulmonary Rehab   Date  03/27/15   Educator  BS   Instruction Review Code  2- meets  goals/outcomes      Stress Management: - Provides group verbal and written instruction about the health risks of elevated stress, cause of high stress, and healthy ways to reduce stress.   Depression: - Provides group verbal and written instruction on the correlation between heart/lung disease and depressed mood, treatment options, and the stigmas associated with seeking treatment.      Cardiac Rehab from 04/17/2015 in [facility] Cardiac and Pulmonary Rehab   Date  03/15/15   Educator  Kathreen Cornfield, [facility]   Instruction Review Code  2- meets goals/outcomes      Anatomy & Physiology of the Heart: - Group verbal and written instruction and models provide basic cardiac anatomy and physiology, with the coronary electrical and arterial systems. Review of: AMI, Angina, Valve disease, Heart Failure, Cardiac Arrhythmia, Pacemakers, and the ICD.      Cardiac Rehab from 04/17/2015 in [facility] Cardiac and Pulmonary Rehab   Date  04/03/15   Educator  SB   Instruction Review Code  2- meets goals/outcomes      Cardiac Procedures: - Group verbal and written instruction and models to describe the testing methods done to diagnose heart disease. Reviews the outcomes of the test results. Describes the treatment choices: Medical Management, Angioplasty, or Coronary Bypass Surgery.      Cardiac Rehab from 04/17/2015 in [facility] Cardiac and Pulmonary Rehab   Date  04/10/15   Educator  SB   Instruction Review Code  2- meets goals/outcomes      Cardiac Medications: - Group verbal and written instruction to review commonly prescribed medications for heart disease. Reviews the medication, class of the drug, and side effects. Includes the steps  to properly store meds and maintain the prescription regimen.      Cardiac Rehab from 04/17/2015 in [facility] Cardiac and Pulmonary Rehab   Date  04/12/15   Educator  DW   Instruction Review Code  2- meets goals/outcomes      Go Sex-Intimacy & Heart Disease, Get SMART - Goal Setting: -  Group verbal and written instruction through game format to discuss heart disease and the return to sexual intimacy. Provides group verbal and written material to discuss and apply goal setting through the application of the S.M.A.R.T. Method.      Cardiac Rehab from 04/17/2015 in [facility] Cardiac and Pulmonary Rehab   Date  04/10/15   Educator  SB   Instruction Review Code  2- meets goals/outcomes      Other Matters of the Heart: - Provides group verbal, written materials and models to describe Heart Failure, Angina, Valve Disease, and Diabetes in the realm of heart disease. Includes description of the disease process and treatment options available to the cardiac patient.      Cardiac Rehab from 04/17/2015 in [facility] Cardiac and Pulmonary Rehab   Date  02/20/15   Educator  SB   Instruction Review Code  2- meets goals/outcomes      Exercise & Equipment Safety: - Individual verbal instruction and demonstration of equipment use and safety with use of the equipment.      Cardiac Rehab from 04/17/2015 in [facility] Cardiac and Pulmonary Rehab   Date  02/08/15   Educator  D. Joya Gaskins, RN   Instruction Review Code  1- partially meets, needs review/practice      Infection Prevention: - Provides verbal and written material to individual with discussion of infection control including proper hand washing and proper equipment cleaning during exercise session.      Cardiac Rehab from 04/17/2015 in [facility] Cardiac and Pulmonary Rehab   Date  02/08/15   Educator  D. Joya Gaskins, RN   Instruction Review Code  2- meets goals/outcomes      Falls Prevention: - Provides verbal and written material to individual with discussion of falls prevention and safety.      Cardiac Rehab from 04/17/2015 in [facility] Cardiac and Pulmonary Rehab   Date  02/08/15   Educator  D. Joya Gaskins, RN   Instruction Review Code  2- meets goals/outcomes      Diabetes: - Individual verbal and written instruction to review signs/symptoms of diabetes,  desired ranges of glucose level fasting, after meals and with exercise. Advice that pre and post exercise glucose checks will be done for 3 sessions at entry of program.    Knowledge Questionnaire Score:     Knowledge Questionnaire Score - 02/08/15 1523    Knowledge Questionnaire Score   Pre Score 22/28      Core Components/Risk Factors/Patient Goals at Admission:     Personal Goals and Risk Factors at Admission - 02/08/15 1527    Core Components/Risk Factors/Patient Goals on Admission   Sedentary Yes   Intervention (read-only) While in program, learn and follow the exercise prescription taught. Start at a low level workload and increase workload after able to maintain previous level for 30 minutes. Increase time before increasing intensity.   Hypertension Yes   Goal Participant will see blood pressure controlled within the values of 140/54m/Hg or within value directed by their physician.   Intervention (read-only) Provide nutrition & aerobic exercise along with prescribed medications to achieve BP 140/90 or less.   Lipids Yes   Goal  Cholesterol controlled with medications as prescribed, with individualized exercise RX and with personalized nutrition plan. Value goals: LDL < 85m, HDL > 44m Participant states understanding of desired cholesterol values and following prescriptions.   Intervention (read-only) Provide nutrition & aerobic exercise along with prescribed medications to achieve LDL <7045mHDL >81m12m Stress Yes   Goal To meet with psychosocial counselor for stress and relaxation information and guidance. To state understanding of performing relaxation techniques and or identifying personal stressors.   Intervention (read-only) Provide education on types of stress, identifiying stressors, and ways to cope with stress. Provide demonstration and active practice of relaxation techniques.   Personal Goal Other Yes   Personal Goal Patient is an assiEnvironmental consultantcipal at elementary  school.  Due to her cardiac arrest, NSTEMI, and PCI patient has not been able return to work.  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  She is seeing Spee[employer]stronomeratient would like to return to work.     Intervention Participate in Cardiac Rehab to regain strength, endurance, and overall physical and mental health.     Take Less Medication Yes   Intervention Learn your risk factors and begin the lifestyle modifications for risk factor control during your time in the program.   Understand more about Heart/Pulmonary Disease. Yes   Intervention While in program utilize professionals for any questions, and attend the education sessions. Great websites to use are www.americanheart.org or www.lung.org for reliable information.      Core Components/Risk Factors/Patient Goals Review:      Goals and Risk Factor Review      02/13/15 1818 03/09/15 1622 03/30/15 1616 03/30/15 1619     Core Components/Risk Factors/Patient Goals Review   Personal Goals Review Increase Aerobic Exercise and Physical Activity;Hypertension;Lipids;Stress  Weight Management/Obesity;Hypertension;Lipids;Stress     Review   TamaShatekaned in her 48 h22ter monitor the other day and said she went to the MD today and he didn't say anything about it. TamaDoriannad her MD said her last cholestrol blood work was ok so he didn't need to check it for 6 months. Systolic bp [phone] MD changed her BP meds about a month ago.  Provide nutrition & aerobic exercise along with prescribed medications to achieve LDL <70mg61mL >81mg.58mExpected Outcomes    Participant will see blood pressure controlled within the values of 140/90mm/H76m within value directed by their physician.Provide nutrition & aerobic exercise along with prescribed medications to achieve LDL <70mg, H51m81mg.. W87mt is much better.     Increase Aerobic Exercise and Physical  Activity (read-only)   Comments Spoke with Tammy today to review the exercise progam process and how the staff will work with her to slowly increase her workloads to help her build up her stamina.       Hypertension (read-only)   Goal Participant will see blood pressure controlled within the values of 140/90mm/Hg o55mthin value directed by their physician.       Progress seen toward goals  Yes      Comments Reviewed the risk factor and the steps to help keep and maintain control of the risk factor: Medication compliance, Nutrition and the exercise program.  Tammy stated understanding of th einformation provided today. Deandre's blood pressure is 130/-120 s827/-078/70      A/67rmal Lipids (read-only)   Goal Cholesterol controlled with medications as prescribed, with individualized exercise RX  and with personalized nutrition plan. Value goals: LDL < '70mg'$ , HDL > '40mg'$ . Participant states understanding of desired cholesterol values and following prescriptions.       Progress seen towards goals  Unknown      Comments Reviewed the risk factor and the steps to help keep and maintain control of the risk factor: Medication compliance, Nutrition and the exercise program.  Tammy stated understanding of th einformation provided today.       Stress (read-only)   Goal To meet with psychosocial counselor for stress and relaxation information and guidance. To state understanding of performing relaxation techniques and or identifying personal stressors.       Progress seen towards goals  Yes      Comments Met with psychosocial counselor today Elanah said she has a good support system and feels her stress is undercontrol.          Core Components/Risk Factors/Patient Goals at Discharge (Final Review):      Goals and Risk Factor Review - 03/30/15 1619    Core Components/Risk Factors/Patient Goals Review   Review Provide nutrition & aerobic exercise along with prescribed medications to achieve LDL '70mg'$ , HDL >'40mg'$ .    Expected Outcomes Participant will see blood pressure controlled within the values of 140/70m/Hg or within value directed by their physician.Provide nutrition & aerobic exercise along with prescribed medications to achieve LDL '70mg'$ , HDL >'40mg'$ .. Weight is much better.       ITP Comments:     ITP Comments      02/16/15 1323 03/02/15 1724 03/13/15 1707 03/14/15 0735 03/15/15 1748   ITP Comments Ready for 30 day review. Continue with ITP.  New to program has 2 visits completed since orietation TEdwenasaid she is working with a Neuropsychologist who is having her do puzzles to increase her memory and function of her mind.  Tammy saw her physician Friday, does not have to wear her Lifevest any longer.   30 day review.  Continue with ITP Patient no longer wearing Life Vest.  Patient scheduled for Stress Test tomorrow.  Will not be here for Cardiac Rehab tomorrow.       03/30/15 1615 04/13/15 1536         ITP Comments TSkilaturned in her 463holter monitor the other day and said she went to the MD today and he didn't say anything about it.  30 Day Review. Continue with the ITP.         Comments:

## 2015-04-20 DIAGNOSIS — I214 Non-ST elevation (NSTEMI) myocardial infarction: Secondary | ICD-10-CM | POA: Diagnosis not present

## 2015-04-20 DIAGNOSIS — Z9861 Coronary angioplasty status: Secondary | ICD-10-CM

## 2015-04-20 NOTE — Progress Notes (Signed)
Cardiac Individual Treatment Plan  Patient Details  Name: GERALYN FIGIEL MRN: [id number] Date of Birth: 09-Feb-1965 Referring Provider:  Concha Pyo, MD  Initial Encounter Date:       Cardiac Rehab from 02/08/2015 in [facility] Cardiac and Pulmonary Rehab   Date  02/08/15      Visit Diagnosis: S/P PTCA (percutaneous transluminal coronary angioplasty)  NSTEMI (non-ST elevated myocardial infarction) [facility])  Patient's Home Medications on Admission:  Current outpatient prescriptions:  .  amLODipine (NORVASC) 5 MG tablet, Take 5 mg by mouth every morning., Disp: , Rfl:  .  aspirin EC 81 MG tablet, Take 81 mg by mouth every morning., Disp: , Rfl:  .  atorvastatin (LIPITOR) 80 MG tablet, Take 80 mg by mouth at bedtime., Disp: , Rfl:  .  EPINEPHrine 0.3 mg/0.3 mL IJ SOAJ injection, Inject 0.3 mg into the muscle Once PRN., Disp: , Rfl:  .  lisinopril (PRINIVIL,ZESTRIL) 20 MG tablet, Take 20 mg by mouth daily., Disp: , Rfl:  .  magnesium oxide (MAG-OX) 400 MG tablet, Take 400 mg by mouth daily., Disp: , Rfl:  .  metoprolol succinate (TOPROL-XL) 25 MG 24 hr tablet, Take 25 mg by mouth daily., Disp: , Rfl:  .  nitroGLYCERIN (NITROSTAT) 0.4 MG SL tablet, Place 0.4 mg under the tongue every 5 (five) minutes x 3 doses as needed. For Chest Pain, Disp: , Rfl:  .  ticagrelor (BRILINTA) 90 MG TABS tablet, Take 90 mg by mouth every 12 (twelve) hours., Disp: , Rfl:   Past Medical History: No past medical history on file.  Tobacco Use: History  Smoking status  . Never Smoker   Smokeless tobacco  . Never Used    Labs: Recent Review Flowsheet Data    There is no flowsheet data to display.       Exercise Target Goals:    Exercise Program Goal: Individual exercise prescription set with THRR, safety & activity barriers. Participant demonstrates ability to understand and report RPE using BORG scale, to self-measure pulse accurately, and to acknowledge the importance of the exercise  prescription.  Exercise Prescription Goal: Starting with aerobic activity 30 plus minutes a day, 3 days per week for initial exercise prescription. Provide home exercise prescription and guidelines that participant acknowledges understanding prior to discharge.  Activity Barriers & Risk Stratification:     Activity Barriers & Cardiac Risk Stratification - 02/08/15 1522    Activity Barriers & Cardiac Risk Stratification   Activity Barriers None   Cardiac Risk Stratification High      6 Minute Walk:     6 Minute Walk      02/08/15 1438 04/17/15 1656     6 Minute Walk   Phase Initial Discharge    Distance 1135 feet 1600 feet    Walk Time 6 minutes 6 minutes    RPE 7 11    Symptoms No No    Resting HR 68 bpm 53 bpm    Resting BP 110/70 mmHg 124/74 mmHg    Max Ex. HR 117 bpm 103 bpm    Max Ex. BP 120/64 mmHg 144/74 mmHg       Initial Exercise Prescription:     Initial Exercise Prescription - 02/08/15 1400    Date of Initial Exercise Prescription   Date 02/08/15   Treadmill   MPH 2   Grade 0   Minutes 15   Bike   Level 0.4   Watts 12   Minutes 10   Recumbant Bike  Level 3   RPM 40   Watts 25   Minutes 15   NuStep   Level 3   Watts 30   Minutes 15   Arm Ergometer   Level 1   Watts 8   Minutes 10   Arm/Foot Ergometer   Level 4   Watts 15   Minutes 10   Cybex   Level 3   RPM 50   Minutes 10   Recumbant Elliptical   Level 1   RPM 40   Watts 10   Minutes 10   Elliptical   Level 1   Speed 3   Minutes 1   REL-XR   Level 3   Watts 40   Minutes 15   T5 Nustep   Level 2   Watts 20   Minutes 15   Biostep-RELP   Level 3   Watts 25   Minutes 15   Prescription Details   Frequency (times per week) 3   Duration Progress to 30 minutes of continuous aerobic without signs/symptoms of physical distress   Intensity   THRR REST +  30   Ratings of Perceived Exertion 11-15   Progression   Progression Continue progressive overload as per policy  without signs/symptoms or physical distress.   Resistance Training   Training Prescription Yes   Weight 2   Reps 10-15      Perform Capillary Blood Glucose checks as needed.  Exercise Prescription Changes:     Exercise Prescription Changes      02/15/15 1600 02/16/15 1600 02/22/15 1600 02/27/15 1600 03/06/15 0743   Exercise Review   Progression   Yes  Yes   Response to Exercise   Blood Pressure (Admit)     124/76 mmHg   Blood Pressure (Exercise)     136/72 mmHg   Blood Pressure (Exit)     110/62 mmHg   Heart Rate (Admit)     66 bpm   Heart Rate (Exercise)     113 bpm   Heart Rate (Exit)     82 bpm   Rating of Perceived Exertion (Exercise)     12   Symptoms   None None None   Comments   [name] can now exercise for the full time and exercise progression will now focus on increases in intensity  [name] is steadily increasing in speed on the TM and in workload on the seated equipment. Once she has maxed out on her walking speed we will focus on increases in grade. We will introduce inteval training to [place] and see if she wants to pursue this type of training, Interval training will make further progression possible for [name] as she has maxed out on the aerobic exercise and can do continuous exercise for 45-50 minutes with no issue.    Duration   Progress to 50 minutes of aerobic without signs/symptoms of physical distress Progress to 50 minutes of aerobic without signs/symptoms of physical distress Progress to 50 minutes of aerobic without signs/symptoms of physical distress   Intensity   Rest + 30 Rest + 30 Rest + 30   Progression   Progression   Continue progressive overload as per policy without signs/symptoms or physical distress. Continue progressive overload as per policy without signs/symptoms or physical distress. Continue progressive overload as per policy without signs/symptoms or physical distress.   Resistance Training   Training Prescription (read-only)   Yes Yes Yes   Weight  (read-only)   _0 Reps (  read-only)   10-15 10-15 10-15   Interval Training   Interval Training     Yes   Equipment     Treadmill;Recumbant Elliptical  REL= BioStep   Treadmill   MPH (read-only)   _0 Grade (read-only)   0 0 0   Minutes (read-only)   _1 NuStep   Level (read-only) _2 Watts (read-only) _3 Minutes (read-only) _4 REL-XR   Level (read-only)   _5 Watts (read-only)   70 70 70   Minutes (read-only)   _6 Biostep-RELP   Level (read-only)  _7 Watts (read-only)  40 40 40 40   Minutes (read-only)  _8 03/15/15 1600 03/22/15 1600 03/30/15 1600 04/03/15 0629     Exercise Review   Progression Yes Yes Yes Yes    Response to Exercise   Blood Pressure (Admit) --   138/80 mmHg    Blood Pressure (Exercise) --   136/74 mmHg    Blood Pressure (Exit) --   110/62 mmHg    Heart Rate (Admit) --   83 bpm    Heart Rate (Exercise) --   113 bpm    Heart Rate (Exit) --   81 bpm    Rating of Perceived Exertion (Exercise) --   12    Symptoms --   None    Comments --   Reviewed individualized exercise prescription and made increases per departmental policy. Exercise increases were discussed with the patient and they were able to perform the new work loads without issue (no signs or symptoms). [name] is maintaining interval training on the TM and her next goal is to maintain regular exercise at home. Home exercise will be discussed with her at her next visit and progress will be documented.     Duration Progress to 50 minutes of aerobic without signs/symptoms of physical distress Progress to 50 minutes of aerobic without signs/symptoms of physical distress Progress to 50 minutes of aerobic without signs/symptoms of physical distress Progress to 50 minutes of aerobic without signs/symptoms of physical distress    Intensity Rest + 30 Rest + 30 Rest + 30 Rest + 30    Progression   Progression Continue progressive  overload as per policy without signs/symptoms or physical distress. Continue progressive overload as per policy without signs/symptoms or physical distress. Continue progressive overload as per policy without signs/symptoms or physical distress. Continue progressive overload as per policy without signs/symptoms or physical distress.    Resistance Training   Training Prescription    Yes    Weight    3    Reps    10-15    Training Prescription (read-only) Yes       Weight (read-only) 3       Reps (read-only) 10-15       Interval Training   Interval Training Yes Yes Yes Yes    Equipment Treadmill;Recumbant Elliptical  REL= BioStep Treadmill;Recumbant Elliptical  REL= BioStep Treadmill;Recumbant Elliptical  REL= BioStep Treadmill;Recumbant Elliptical  REL= BioStep    Treadmill   MPH    2.6    Grade    3    Minutes    12    MPH (read-only) 2       Grade (read-only) 0  Minutes (read-only) 15       NuStep   Level (read-only) 3       Watts (read-only) 30       Minutes (read-only) 20       REL-XR   Level  _0 Watts  75 75 75    Minutes  _1 Level (read-only) 6       Watts (read-only) 70       Minutes (read-only) 15       Biostep-RELP   Level   5 5    Watts   60 60    Minutes   15 15    Level (read-only) 4       Watts (read-only) 40       Minutes (read-only) 25       Home Exercise Plan   Frequency    Add 2 additional days to program exercise sessions.       Exercise Comments:   Discharge Exercise Prescription (Final Exercise Prescription Changes):     Exercise Prescription Changes - 04/03/15 0629    Exercise Review   Progression Yes   Response to Exercise   Blood Pressure (Admit) 138/80 mmHg   Blood Pressure (Exercise) 136/74 mmHg   Blood Pressure (Exit) 110/62 mmHg   Heart Rate (Admit) 83 bpm   Heart Rate (Exercise) 113 bpm   Heart Rate (Exit) 81 bpm   Rating of Perceived Exertion (Exercise) 12   Symptoms None   Comments Reviewed individualized  exercise prescription and made increases per departmental policy. Exercise increases were discussed with the patient and they were able to perform the new work loads without issue (no signs or symptoms). [name] is maintaining interval training on the TM and her next goal is to maintain regular exercise at home. Home exercise will be discussed with her at her next visit and progress will be documented.    Duration Progress to 50 minutes of aerobic without signs/symptoms of physical distress   Intensity Rest + 30   Progression   Progression Continue progressive overload as per policy without signs/symptoms or physical distress.   Resistance Training   Training Prescription Yes   Weight 3   Reps 10-15   Interval Training   Interval Training Yes   Equipment Treadmill;Recumbant Elliptical  REL= BioStep   Treadmill   MPH 2.6   Grade 3   Minutes 40   REL-XR   Level 4   Watts 75   Minutes 15   Biostep-RELP   Level 5   Watts 60   Minutes 15   Home Exercise Plan   Frequency Add 2 additional days to program exercise sessions.      Nutrition:  Target Goals: Understanding of nutrition guidelines, daily intake of sodium <152m, cholesterol <2038m calories 30% from fat and 7% or less from saturated fats, daily to have 5 or more servings of fruits and vegetables.  Biometrics:     Pre Biometrics - 02/08/15 1438    Pre Biometrics   Height 5' 4.5" (1.638 m)   Weight 163 lb 14.4 oz (74.345 kg)   Waist Circumference 34 inches   Hip Circumference 40.5 inches   Waist to Hip Ratio 0.84 %   BMI (Calculated) 27.8         Post Biometrics - 04/17/15 1657     Post  Biometrics   Height 5' 4.5" (1.638 m)   Weight 171 lb (77.565 kg)   Waist  Circumference 32 inches   Hip Circumference 41.5 inches   Waist to Hip Ratio 0.77 %   BMI (Calculated) 29      Nutrition Therapy Plan and Nutrition Goals:     Nutrition Therapy & Goals - 03/09/15 0935    Nutrition Therapy   Diet basic heart  healthy diet, 1400kcal, working towards DASH guidelines   Drug/Food Interactions Statins/Certain Fruits   Protein (oz) (read-only) 6.5 ounces/day   Fiber 25 grams   Whole Grain Foods 3 servings   Saturated Fats 11 max. grams   Fruits and Vegetables 5 servings/day   Personal Nutrition Goals   Personal Goal #1 Increase water intake; gradually add 4-8oz per day    Personal Goal #2 Add some servings of vegetables and fruits -- plan to include at least one with each meal and/or snack; or monitor your daily servings   Comments Patient reports she is a picky eater, and diet changes are a challenge. Encouraged gradual, manageable changes, and 1-3 changes at a time. She will start with goals listed above.      Nutrition Discharge: Rate Your Plate Scores:     Nutrition Assessments - 03/09/15 0939    Rate Your Plate Scores   Pre Score 57   Pre Score % 63 %      Nutrition Goals Re-Evaluation:     Nutrition Goals Re-Evaluation      03/09/15 1622 03/30/15 1616         Personal Goal #1 Re-Evaluation   Personal Goal #1  Lenya reports she has increased her water intake.       Goal Progress Seen Yes Yes      Comments Catha admitted today "i eat terrible but the dietician was encouraging to get me to eat healthier".        Personal Goal #2 Re-Evaluation   Personal Goal #2  Rael said she is trying to increase her fruits and vegetables "but I am a picky eater like a child".       Goal Progress Seen  Yes         Psychosocial: Target Goals: Acknowledge presence or absence of depression, maximize coping skills, provide positive support system. Participant is able to verbalize types and ability to use techniques and skills needed for reducing stress and depression.  Initial Review & Psychosocial Screening:     Initial Psych Review & Screening - 02/08/15 1731    Family Dynamics   Comments Patient has a 23 year old daughter and a 67 year old son.  Her sister, Dereck Ligas, who works at  [employer], is also supportive and is involved.  Patient scored a 10 on PHQ-9 due in part to problems associated with anoxic brain injury.  Pt is slow to respond and process information.  Pt. is undergoing speech therapy.     Screening Interventions   Interventions Encouraged to exercise;Program counselor consult      Quality of Life Scores:     Quality of Life - 02/08/15 1738    Quality of Life Scores   Health/Function Pre 13.96 %   Socioeconomic Pre 18.69 %   Psych/Spiritual Pre 28.29 %   Family Pre 25.5 %   GLOBAL Pre 19.72 %      PHQ-9:     Recent Review Flowsheet Data    Depression screen [facility] 2/9 02/08/2015   Decreased Interest 0   Down, Depressed, Hopeless 0   PHQ - 2 Score 0   Altered sleeping 0  Tired, decreased energy 3   Change in appetite 3   Feeling bad or failure about yourself  0   Trouble concentrating 1   Moving slowly or fidgety/restless 3   Suicidal thoughts 0   PHQ-9 Score 10   Difficult doing work/chores Somewhat difficult      Psychosocial Evaluation and Intervention:     Psychosocial Evaluation - 02/13/15 1739    Psychosocial Evaluation & Interventions   Interventions Encouraged to exercise with the program and follow exercise prescription   Comments Counselor met with Ms. Amiri today for initial psychosocial evaluation.  She is a 50 year old who went into cardiac arrest in October and was out of oxygen for quite some time, causing some cognitive problems currently.  She has a strong support system with older children living in her home and a sister close by.  She also is actively involved in her local church community.  Ms. Ebbert states she sleeps well and although a "picky eater", her appetite is generally okay.  She denies a history of depression or anxiety or current symptoms.  She also states her mood is typically positive.  Ms. 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  She also is unable to drive currently.  Her goals for this program are to increase her stamina and strength to be able to go up and down her stairs in her two story home with greater ease.  She plans to look into local resources, or a gym for maintenance upon completion of this program.        Psychosocial Re-Evaluation:     Psychosocial Re-Evaluation      03/08/15 1707 03/09/15 1624         Psychosocial Re-Evaluation   Comments Follow up with Ms. Cotterman reporting a positive report from her cardiologist and hoping to be taken off the "life vest" soon.  She states she feels increased stamina since coming into this program and hopes that her neurological condition will also improve in time so that she can return to a more normal life with work and driving again.  She continues to maintain a positive attitude.  Counselor will continue to follow with her.   Shawndrea said she has a good support system and feels her stress is undercontrol.          Vocational Rehabilitation: Provide vocational rehab assistance to qualifying candidates.   Vocational Rehab Evaluation & Intervention:     Vocational Rehab - 02/08/15 1523    Initial Vocational Rehab Evaluation & Intervention   Assessment shows need for Vocational Rehabilitation No      Education: Education Goals: Education classes will be provided on a weekly basis, covering required topics. Participant will state understanding/return demonstration of topics presented.  Learning Barriers/Preferences:     Learning Barriers/Preferences - 02/08/15 1523    Learning Barriers/Preferences   Learning Barriers None   Learning Preferences Written Material      Education Topics: General Nutrition Guidelines/Fats and Fiber: -Group instruction provided by verbal, written material, models and posters to present the general guidelines for heart healthy nutrition. Gives an explanation and  review of dietary fats and fiber.          Cardiac Rehab from 04/17/2015 in [facility] Cardiac and Pulmonary Rehab   Date  02/27/15   Educator  PI   Instruction Review Code  2- meets goals/outcomes      Controlling Sodium/Reading Food Labels: -Group verbal and written material supporting the discussion of sodium use in heart healthy nutrition. Review and explanation with models, verbal and written materials for utilization of the food label.      Cardiac Rehab from 04/17/2015 in [facility] Cardiac and Pulmonary Rehab   Date  03/06/15   Educator  PI   Instruction Review Code  2- meets goals/outcomes      Exercise Physiology & Risk Factors: - Group verbal and written instruction with models to review the exercise physiology of the cardiovascular system and associated critical values. Details cardiovascular disease risk factors and the goals associated with each risk factor.      Cardiac Rehab from 04/17/2015 in [facility] Cardiac and Pulmonary Rehab   Date  03/20/15   Educator  sw   Instruction Review Code  2- meets goals/outcomes      Aerobic Exercise & Resistance Training: - Gives group verbal and written discussion on the health impact of inactivity. On the components of aerobic and resistive training programs and the benefits of this training and how to safely progress through these programs.      Cardiac Rehab from 04/17/2015 in [facility] Cardiac and Pulmonary Rehab   Date  03/22/15   Educator  BS   Instruction Review Code  2- meets goals/outcomes      Flexibility, Balance, General Exercise Guidelines: - Provides group verbal and written instruction on the benefits of flexibility and balance training programs. Provides general exercise guidelines with specific guidelines to those with heart or lung disease. Demonstration and skill practice provided.      Cardiac Rehab from 04/17/2015 in [facility] Cardiac and Pulmonary Rehab   Date  03/27/15   Educator  BS   Instruction Review Code  2- meets  goals/outcomes      Stress Management: - Provides group verbal and written instruction about the health risks of elevated stress, cause of high stress, and healthy ways to reduce stress.   Depression: - Provides group verbal and written instruction on the correlation between heart/lung disease and depressed mood, treatment options, and the stigmas associated with seeking treatment.      Cardiac Rehab from 04/17/2015 in [facility] Cardiac and Pulmonary Rehab   Date  03/15/15   Educator  Kathreen Cornfield, [facility]   Instruction Review Code  2- meets goals/outcomes      Anatomy & Physiology of the Heart: - Group verbal and written instruction and models provide basic cardiac anatomy and physiology, with the coronary electrical and arterial systems. Review of: AMI, Angina, Valve disease, Heart Failure, Cardiac Arrhythmia, Pacemakers, and the ICD.      Cardiac Rehab from 04/17/2015 in [facility] Cardiac and Pulmonary Rehab   Date  04/03/15   Educator  SB   Instruction Review Code  2- meets goals/outcomes      Cardiac Procedures: - Group verbal and written instruction and models to describe the testing methods done to diagnose heart disease. Reviews the outcomes of the test results. Describes the treatment choices: Medical Management, Angioplasty, or Coronary Bypass Surgery.      Cardiac Rehab from 04/17/2015 in [facility] Cardiac and Pulmonary Rehab   Date  04/10/15   Educator  SB   Instruction Review Code  2- meets goals/outcomes      Cardiac Medications: - Group verbal and written instruction to review commonly prescribed medications for heart disease. Reviews the medication, class of the drug, and side effects. Includes the steps  to properly store meds and maintain the prescription regimen.      Cardiac Rehab from 04/17/2015 in [facility] Cardiac and Pulmonary Rehab   Date  04/12/15   Educator  DW   Instruction Review Code  2- meets goals/outcomes      Go Sex-Intimacy & Heart Disease, Get SMART - Goal Setting: -  Group verbal and written instruction through game format to discuss heart disease and the return to sexual intimacy. Provides group verbal and written material to discuss and apply goal setting through the application of the S.M.A.R.T. Method.      Cardiac Rehab from 04/17/2015 in [facility] Cardiac and Pulmonary Rehab   Date  04/10/15   Educator  SB   Instruction Review Code  2- meets goals/outcomes      Other Matters of the Heart: - Provides group verbal, written materials and models to describe Heart Failure, Angina, Valve Disease, and Diabetes in the realm of heart disease. Includes description of the disease process and treatment options available to the cardiac patient.      Cardiac Rehab from 04/17/2015 in [facility] Cardiac and Pulmonary Rehab   Date  02/20/15   Educator  SB   Instruction Review Code  2- meets goals/outcomes      Exercise & Equipment Safety: - Individual verbal instruction and demonstration of equipment use and safety with use of the equipment.      Cardiac Rehab from 04/17/2015 in [facility] Cardiac and Pulmonary Rehab   Date  02/08/15   Educator  D. Joya Gaskins, RN   Instruction Review Code  1- partially meets, needs review/practice      Infection Prevention: - Provides verbal and written material to individual with discussion of infection control including proper hand washing and proper equipment cleaning during exercise session.      Cardiac Rehab from 04/17/2015 in [facility] Cardiac and Pulmonary Rehab   Date  02/08/15   Educator  D. Joya Gaskins, RN   Instruction Review Code  2- meets goals/outcomes      Falls Prevention: - Provides verbal and written material to individual with discussion of falls prevention and safety.      Cardiac Rehab from 04/17/2015 in [facility] Cardiac and Pulmonary Rehab   Date  02/08/15   Educator  D. Joya Gaskins, RN   Instruction Review Code  2- meets goals/outcomes      Diabetes: - Individual verbal and written instruction to review signs/symptoms of diabetes,  desired ranges of glucose level fasting, after meals and with exercise. Advice that pre and post exercise glucose checks will be done for 3 sessions at entry of program.    Knowledge Questionnaire Score:     Knowledge Questionnaire Score - 02/08/15 1523    Knowledge Questionnaire Score   Pre Score 22/28      Core Components/Risk Factors/Patient Goals at Admission:     Personal Goals and Risk Factors at Admission - 02/08/15 1527    Core Components/Risk Factors/Patient Goals on Admission   Sedentary Yes   Intervention (read-only) While in program, learn and follow the exercise prescription taught. Start at a low level workload and increase workload after able to maintain previous level for 30 minutes. Increase time before increasing intensity.   Hypertension Yes   Goal Participant will see blood pressure controlled within the values of 140/54m/Hg or within value directed by their physician.   Intervention (read-only) Provide nutrition & aerobic exercise along with prescribed medications to achieve BP 140/90 or less.   Lipids Yes   Goal  Cholesterol controlled with medications as prescribed, with individualized exercise RX and with personalized nutrition plan. Value goals: LDL < 85m, HDL > 44m Participant states understanding of desired cholesterol values and following prescriptions.   Intervention (read-only) Provide nutrition & aerobic exercise along with prescribed medications to achieve LDL <7045mHDL >81m12m Stress Yes   Goal To meet with psychosocial counselor for stress and relaxation information and guidance. To state understanding of performing relaxation techniques and or identifying personal stressors.   Intervention (read-only) Provide education on types of stress, identifiying stressors, and ways to cope with stress. Provide demonstration and active practice of relaxation techniques.   Personal Goal Other Yes   Personal Goal Patient is an assiEnvironmental consultantcipal at elementary  school.  Due to her cardiac arrest, NSTEMI, and PCI patient has not been able return to work.  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  She is seeing SpeeAstronomeratient would like to return to work.     Intervention Participate in Cardiac Rehab to regain strength, endurance, and overall physical and mental health.     Take Less Medication Yes   Intervention Learn your risk factors and begin the lifestyle modifications for risk factor control during your time in the program.   Understand more about Heart/Pulmonary Disease. Yes   Intervention While in program utilize professionals for any questions, and attend the education sessions. Great websites to use are www.americanheart.org or www.lung.org for reliable information.      Core Components/Risk Factors/Patient Goals Review:      Goals and Risk Factor Review      02/13/15 1818 03/09/15 1622 03/30/15 1616 03/30/15 1619     Core Components/Risk Factors/Patient Goals Review   Personal Goals Review Increase Aerobic Exercise and Physical Activity;Hypertension;Lipids;Stress  Weight Management/Obesity;Hypertension;Lipids;Stress     Review   TamaShatekaned in her 48 h22ter monitor the other day and said she went to the MD today and he didn't say anything about it. TamaDoriannad her MD said her last cholestrol blood work was ok so he didn't need to check it for 6 months. Systolic bp [phone] MD changed her BP meds about a month ago.  Provide nutrition & aerobic exercise along with prescribed medications to achieve LDL <70mg61mL >81mg.58mExpected Outcomes    Participant will see blood pressure controlled within the values of 140/90mm/H76m within value directed by their physician.Provide nutrition & aerobic exercise along with prescribed medications to achieve LDL <70mg, H51m81mg.. W87mt is much better.     Increase Aerobic Exercise and Physical  Activity (read-only)   Comments Spoke with Tammy today to review the exercise progam process and how the staff will work with her to slowly increase her workloads to help her build up her stamina.       Hypertension (read-only)   Goal Participant will see blood pressure controlled within the values of 140/90mm/Hg o55mthin value directed by their physician.       Progress seen toward goals  Yes      Comments Reviewed the risk factor and the steps to help keep and maintain control of the risk factor: Medication compliance, Nutrition and the exercise program.  Tammy stated understanding of th einformation provided today. Deandre's blood pressure is 130/-120 s827/-078/70      A/67rmal Lipids (read-only)   Goal Cholesterol controlled with medications as prescribed, with individualized exercise RX  and with personalized nutrition plan. Value goals: LDL < 66m, HDL > 460m Participant states understanding of desired cholesterol values and following prescriptions.       Progress seen towards goals  Unknown      Comments Reviewed the risk factor and the steps to help keep and maintain control of the risk factor: Medication compliance, Nutrition and the exercise program.  Tammy stated understanding of th einformation provided today.       Stress (read-only)   Goal To meet with psychosocial counselor for stress and relaxation information and guidance. To state understanding of performing relaxation techniques and or identifying personal stressors.       Progress seen towards goals  Yes      Comments Met with psychosocial counselor today TaLearaaid she has a good support system and feels her stress is undercontrol.          Core Components/Risk Factors/Patient Goals at Discharge (Final Review):      Goals and Risk Factor Review - 03/30/15 1619    Core Components/Risk Factors/Patient Goals Review   Review Provide nutrition & aerobic exercise along with prescribed medications to achieve LDL <7061mHDL >54m63m  Expected Outcomes Participant will see blood pressure controlled within the values of 140/90mm79mor within value directed by their physician.Provide nutrition & aerobic exercise along with prescribed medications to achieve LDL <70mg,81m >54mg..67mght is much better.       ITP Comments:     ITP Comments      02/16/15 1323 03/02/15 1724 03/13/15 1707 03/14/15 0735 03/15/15 1748   ITP Comments Ready for 30 day review. Continue with ITP.  New to program has 2 visits completed since orietation Brion Larindahe is working with a Neuropsychologist who is having her do puzzles to increase her memory and function of her mind.  Tammy saw her physician Friday, does not have to wear her Lifevest any longer.   30 day review.  Continue with ITP Patient no longer wearing Life Vest.  Patient scheduled for Stress Test tomorrow.  Will not be here for Cardiac Rehab tomorrow.       03/30/15 1615 04/13/15 1536         ITP Comments Lorissa Kieanna in her 48 holt52 monitor the other day and said she went to the MD today and he didn't say anything about it.  30 Day Review. Continue with the ITP.         Comments: Patient completed the Cardiac Rehab program today.  She plans to continue exercising at home by walking and using her son's weights.

## 2015-04-20 NOTE — Progress Notes (Signed)
Daily Session Note  Patient Details  Name: Sarah Bowen MRN: [id number] Date of Birth: 1965/12/19 Referring Provider:  Concha Pyo, MD  Encounter Date: 04/20/2015  Check In:     Session Check In - 04/20/15 1608    Check-In   Location [facility]   Staff Present Gerlene Burdock, RN, BSN;Briselda Naval, BS, ACSM EP-C, Exercise Physiologist;Diane Joya Gaskins, RN, BSN   Supervising physician immediately available to respond to emergencies See telemetry face sheet for immediately available ER MD   Medication changes reported     No   Fall or balance concerns reported    No   Warm-up and Cool-down Performed on first and last piece of equipment   Resistance Training Performed No   VAD Patient? No   Pain Assessment   Currently in Pain? No/denies         Goals Met:  Proper associated with RPD/PD & O2 Sat Exercise tolerated well No report of cardiac concerns or symptoms Strength training completed today  Goals Unmet:  Not Applicable  Comments:    Dr. Emily Filbert is Medical Director for [facility].

## 2015-04-20 NOTE — Progress Notes (Signed)
Discharge Summary  Patient Details  Name: Sarah Bowen MRN: [id number] Date of Birth: Apr 18, 1965 Referring Provider:  Concha Pyo, MD   Number of Visits: 63  Reason for Discharge:  Patient reached a stable level of exercise. Patient independent in their exercise.  Smoking History:  History  Smoking status  . Never Smoker   Smokeless tobacco  . Never Used    Diagnosis:  S/P PTCA (percutaneous transluminal coronary angioplasty)  NSTEMI (non-ST elevated myocardial infarction) ([place])  ADL [facility]:   Initial Exercise Prescription:     Initial Exercise Prescription - 02/08/15 1400    Date of Initial Exercise Prescription   Date 02/08/15   Treadmill   MPH 2   Grade 0   Minutes 15   Bike   Level 0.4   Watts 12   Minutes 10   Recumbant Bike   Level 3   RPM 40   Watts 25   Minutes 15   NuStep   Level 3   Watts 30   Minutes 15   Arm Ergometer   Level 1   Watts 8   Minutes 10   Arm/Foot Ergometer   Level 4   Watts 15   Minutes 10   Cybex   Level 3   RPM 50   Minutes 10   Recumbant Elliptical   Level 1   RPM 40   Watts 10   Minutes 10   Elliptical   Level 1   Speed 3   Minutes 1   REL-XR   Level 3   Watts 40   Minutes 15   T5 Nustep   Level 2   Watts 20   Minutes 15   Biostep-RELP   Level 3   Watts 25   Minutes 15   Prescription Details   Frequency (times per week) 3   Duration Progress to 30 minutes of continuous aerobic without signs/symptoms of physical distress   Intensity   THRR REST +  30   Ratings of Perceived Exertion 11-15   Progression   Progression Continue progressive overload as per policy without signs/symptoms or physical distress.   Resistance Training   Training Prescription Yes   Weight 2   Reps 10-15      Discharge Exercise Prescription (Final Exercise Prescription Changes):     Exercise Prescription Changes - 04/03/15 0629    Exercise Review   Progression Yes   Response to Exercise   Blood Pressure  (Admit) 138/80 mmHg   Blood Pressure (Exercise) 136/74 mmHg   Blood Pressure (Exit) 110/62 mmHg   Heart Rate (Admit) 83 bpm   Heart Rate (Exercise) 113 bpm   Heart Rate (Exit) 81 bpm   Rating of Perceived Exertion (Exercise) 12   Symptoms None   Comments Reviewed individualized exercise prescription and made increases per departmental policy. Exercise increases were discussed with the patient and they were able to perform the new work loads without issue (no signs or symptoms). Sarah Bowen is maintaining interval training on the TM and her next goal is to maintain regular exercise at home. Home exercise will be discussed with her at her next visit and progress will be documented.    Duration Progress to 50 minutes of aerobic without signs/symptoms of physical distress   Intensity Rest + 30   Progression   Progression Continue progressive overload as per policy without signs/symptoms or physical distress.   Resistance Training   Training Prescription Yes   Weight 3   Reps 10-15  Interval Training   Interval Training Yes   Equipment Treadmill;Recumbant Elliptical  REL= BioStep   Treadmill   MPH 2.6   Grade 3   Minutes 40   REL-XR   Level 4   Watts 75   Minutes 15   Biostep-RELP   Level 5   Watts 60   Minutes 15   Home Exercise Plan   Frequency Add 2 additional days to program exercise sessions.      Functional Capacity:     6 Minute Walk      02/08/15 1438 04/17/15 1656     6 Minute Walk   Phase Initial Discharge    Distance 1135 feet 1600 feet    Walk Time 6 minutes 6 minutes    RPE 7 11    Symptoms No No    Resting HR 68 bpm 53 bpm    Resting BP 110/70 mmHg 124/74 mmHg    Max Ex. HR 117 bpm 103 bpm    Max Ex. BP 120/64 mmHg 144/74 mmHg       Psychological, QOL, Others - Outcomes: PHQ 2/9: Depression screen PHQ 2/9 02/08/2015  Decreased Interest 0  Down, Depressed, Hopeless 0  PHQ - 2 Score 0  Altered sleeping 0  Tired, decreased energy 3  Change in appetite  3  Feeling bad or failure about yourself  0  Trouble concentrating 1  Moving slowly or fidgety/restless 3  Suicidal thoughts 0  PHQ-9 Score 10  Difficult doing work/chores Somewhat difficult    Quality of Life:     Quality of Life - 02/08/15 1738    Quality of Life Scores   Health/Function Pre 13.96 %   Socioeconomic Pre 18.69 %   Psych/Spiritual Pre 28.29 %   Family Pre 25.5 %   GLOBAL Pre 19.72 %      Personal Goals: Goals established at orientation with interventions provided to work toward goal.     Personal Goals and Risk Factors at Admission - 02/08/15 1527    Core Components/Risk Factors/Patient Goals on Admission   Sedentary Yes   Intervention (read-only) While in program, learn and follow the exercise prescription taught. Start at a low level workload and increase workload after able to maintain previous level for 30 minutes. Increase time before increasing intensity.   Hypertension Yes   Goal Participant will see blood pressure controlled within the values of 140/48m/Hg or within value directed by their physician.   Intervention (read-only) Provide nutrition & aerobic exercise along with prescribed medications to achieve BP 140/90 or less.   Lipids Yes   Goal Cholesterol controlled with medications as prescribed, with individualized exercise RX and with personalized nutrition plan. Value goals: LDL < '70mg'$ , HDL > '40mg'$ . Participant states understanding of desired cholesterol values and following prescriptions.   Intervention (read-only) Provide nutrition & aerobic exercise along with prescribed medications to achieve LDL '70mg'$ , HDL >'40mg'$ .   Stress Yes   Goal To meet with psychosocial counselor for stress and relaxation information and guidance. To state understanding of performing relaxation techniques and or identifying personal stressors.   Intervention (read-only) Provide education on types of stress, identifiying stressors, and ways to cope with stress. Provide  demonstration and active practice of relaxation techniques.   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  Due to her cardiac arrest, NSTEMI, and PCI patient has not been able return to work.  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  She is seeing Astronomer.  Patient would like to return to work.     Intervention Participate in Cardiac Rehab to regain strength, endurance, and overall physical and mental health.     Take Less Medication Yes   Intervention Learn your risk factors and begin the lifestyle modifications for risk factor control during your time in the program.   Understand more about Heart/Pulmonary Disease. Yes   Intervention While in program utilize professionals for any questions, and attend the education sessions. Great websites to use are www.americanheart.org or www.lung.org for reliable information.       Personal Goals Discharge:     Goals and Risk Factor Review      02/13/15 1818 03/09/15 1622 03/30/15 1616 03/30/15 1619     Core Components/Risk Factors/Patient Goals Review   Personal Goals Review Increase Aerobic Exercise and Physical Activity;Hypertension;Lipids;Stress  Weight Management/Obesity;Hypertension;Lipids;Stress     Review   Jasmon turned in her 68 holter monitor the other day and said she went to the MD today and he didn't say anything about it. Chinelo said her MD said her last cholestrol blood work was ok so he didn't need to check it for 6 months. Systolic bp [phone]. Stephnie reports MD changed her BP meds about a month ago.  Provide nutrition & aerobic exercise along with prescribed medications to achieve LDL '70mg'$ , HDL >'40mg'$ .    Expected Outcomes    Participant will see blood pressure controlled within the values of 140/26m/Hg or within value directed by their physician.Provide nutrition & aerobic exercise along with  prescribed medications to achieve LDL '70mg'$ , HDL >'40mg'$ .. Weight is much better.     Increase Aerobic Exercise and Physical Activity (read-only)   Comments Spoke with Tammy today to review the exercise progam process and how the staff will work with her to slowly increase her workloads to help her build up her stamina.       Hypertension (read-only)   Goal Participant will see blood pressure controlled within the values of 140/975mHg or within value directed by their physician.       Progress seen toward goals  Yes      Comments Reviewed the risk factor and the steps to help keep and maintain control of the risk factor: Medication compliance, Nutrition and the exercise program.  Tammy stated understanding of th einformation provided today. Ceylin's blood pressure is 13735/-329ystolic /7/92    Abnormal Lipids (read-only)   Goal Cholesterol controlled with medications as prescribed, with individualized exercise RX and with personalized nutrition plan. Value goals: LDL < '70mg'$ , HDL > '40mg'$ . Participant states understanding of desired cholesterol values and following prescriptions.       Progress seen towards goals  Unknown      Comments Reviewed the risk factor and the steps to help keep and maintain control of the risk factor: Medication compliance, Nutrition and the exercise program.  Tammy stated understanding of th einformation provided today.       Stress (read-only)   Goal To meet with psychosocial counselor for stress and relaxation information and guidance. To state understanding of performing relaxation techniques and or identifying personal stressors.       Progress seen towards goals  Yes      Comments Met with psychosocial counselor today TaAmamdaaid she has a good support system and feels her stress is undercontrol.          Nutrition &  Weight - Outcomes:     Pre Biometrics - 02/08/15 1438    Pre Biometrics   Height 5' 4.5" (1.638 m)   Weight 163 lb 14.4 oz (74.345 kg)   Waist  Circumference 34 inches   Hip Circumference 40.5 inches   Waist to Hip Ratio 0.84 %   BMI (Calculated) 27.8         Post Biometrics - 04/17/15 1657     Post  Biometrics   Height 5' 4.5" (1.638 m)   Weight 171 lb (77.565 kg)   Waist Circumference 32 inches   Hip Circumference 41.5 inches   Waist to Hip Ratio 0.77 %   BMI (Calculated) 29      Nutrition:     Nutrition Therapy & Goals - 03/09/15 0935    Nutrition Therapy   Diet basic heart healthy diet, 1400kcal, working towards DASH guidelines   Drug/Food Interactions Statins/Certain Fruits   Protein (oz) (read-only) 6.5 ounces/day   Fiber 25 grams   Whole Grain Foods 3 servings   Saturated Fats 11 max. grams   Fruits and Vegetables 5 servings/day   Personal Nutrition Goals   Personal Goal #1 Increase water intake; gradually add 4-8oz per day    Personal Goal #2 Add some servings of vegetables and fruits -- plan to include at least one with each meal and/or snack; or monitor your daily servings   Comments Patient reports she is a picky eater, and diet changes are a challenge. Encouraged gradual, manageable changes, and 1-3 changes at a time. She will start with goals listed above.      Nutrition Discharge:     Nutrition Assessments - 03/09/15 0939    Rate Your Plate Scores   Pre Score 57   Pre Score % 63 %      Education Questionnaire Score:     Knowledge Questionnaire Score - 02/08/15 1523    Knowledge Questionnaire Score   Pre Score 22/28     Patient plans to exercise by walking at home and using some of son's weights.    Goals reviewed with patient; copy given to patient.

## 2018-08-28 ENCOUNTER — Encounter: Payer: Self-pay | Admitting: Emergency Medicine

## 2018-08-28 ENCOUNTER — Emergency Department
Admission: EM | Admit: 2018-08-28 | Discharge: 2018-08-28 | Disposition: A | Discharge status: Home / Self Care | Payer: No Typology Code available for payment source | Attending: Emergency Medicine | Admitting: Emergency Medicine

## 2018-08-28 ENCOUNTER — Emergency Department: Payer: No Typology Code available for payment source

## 2018-08-28 ENCOUNTER — Other Ambulatory Visit: Payer: Self-pay

## 2018-08-28 DIAGNOSIS — S0990XA Unspecified injury of head, initial encounter: Secondary | ICD-10-CM

## 2018-08-28 DIAGNOSIS — Y990 Civilian activity done for income or pay: Secondary | ICD-10-CM | POA: Diagnosis not present

## 2018-08-28 DIAGNOSIS — Z79899 Other long term (current) drug therapy: Secondary | ICD-10-CM | POA: Insufficient documentation

## 2018-08-28 DIAGNOSIS — Y9389 Activity, other specified: Secondary | ICD-10-CM | POA: Insufficient documentation

## 2018-08-28 DIAGNOSIS — I2510 Atherosclerotic heart disease of native coronary artery without angina pectoris: Secondary | ICD-10-CM | POA: Diagnosis not present

## 2018-08-28 DIAGNOSIS — I252 Old myocardial infarction: Secondary | ICD-10-CM | POA: Diagnosis not present

## 2018-08-28 DIAGNOSIS — Y92219 Unspecified school as the place of occurrence of the external cause: Secondary | ICD-10-CM | POA: Diagnosis not present

## 2018-08-28 DIAGNOSIS — W01198A Fall on same level from slipping, tripping and stumbling with subsequent striking against other object, initial encounter: Secondary | ICD-10-CM | POA: Insufficient documentation

## 2018-08-28 DIAGNOSIS — S098XXA Other specified injuries of head, initial encounter: Secondary | ICD-10-CM | POA: Diagnosis present

## 2018-08-28 DIAGNOSIS — S0101XA Laceration without foreign body of scalp, initial encounter: Secondary | ICD-10-CM | POA: Diagnosis not present

## 2018-08-28 DIAGNOSIS — Z23 Encounter for immunization: Secondary | ICD-10-CM | POA: Insufficient documentation

## 2018-08-28 DIAGNOSIS — I10 Essential (primary) hypertension: Secondary | ICD-10-CM | POA: Insufficient documentation

## 2018-08-28 DIAGNOSIS — S0181XA Laceration without foreign body of other part of head, initial encounter: Secondary | ICD-10-CM

## 2018-08-28 HISTORY — DX: Essential (primary) hypertension: I10

## 2018-08-28 HISTORY — DX: Cardiac arrest, cause unspecified: I46.9

## 2018-08-28 MED ORDER — TETANUS-DIPHTH-ACELL PERTUSSIS 5-2.5-18.5 LF-MCG/0.5 IM SUSP
0.5000 mL | Freq: Once | INTRAMUSCULAR | Status: AC
  Administered 2018-08-28: 15:00:00 0.5 mL via INTRAMUSCULAR
  Filled 2018-08-28: qty 0.5

## 2018-08-28 MED ORDER — LIDOCAINE-EPINEPHRINE 2 %-1:100000 IJ SOLN
20.0000 mL | Freq: Once | INTRAMUSCULAR | Status: AC
  Administered 2018-08-28: 20 mL
  Filled 2018-08-28: qty 1

## 2018-08-28 NOTE — ED Notes (Signed)
This RN called and spoke with pt's supervisor [employer] [phone]. Per Wells Guiles, no UDS, No BAC required for worker's comp.

## 2018-08-28 NOTE — ED Triage Notes (Signed)
Pt presents to ED via [facility] with c/o fall and head laceration. Per EMS pt was at school when she tripped and hit her head on a metal door frame. Pt states she is on Brillenta for blood thinner. EMS reports unable to stop bleeding from laceration to inner R eye brow, EMS reports pt is on 3rd pressure dressing upon arrival to ED, some oozing noted to bandage in place at this time.   154/84 85 NSR

## 2018-08-28 NOTE — ED Provider Notes (Signed)
[facility] Emergency Department Provider Note  ____________________________________________  Time seen: Approximately 3:21 PM  I have reviewed the triage vital signs and the nursing notes.   HISTORY  Chief Complaint Fall and Facial Laceration    HPI Sarah Bowen is a 53 y.o. female with a history of hypertension CAD status post cardiac arrest, PCI on Brilinta who reports that she was in her usual state of health when she accidentally tripped over a crate at her workplace causing her to fall and strike her forehead against a metal door frame.  No loss of consciousness or neck pain.  Has only mild forehead pain at the site of the injury, nonradiating, no aggravating or alleviating factors.  Denies dizziness or syncope.  No other symptoms at this time, no neck pain.  No arm tingling or weakness.      Past Medical History:  Diagnosis Date  . Cardiac arrest (HCC)   . Hypertension      Patient Active Problem List   Diagnosis Date Noted  . Cerebral anoxic injury (HCC) 12/18/2014  . CAD in native artery 12/14/2014  . Abnormal thyroid stimulating hormone (TSH) level 11/29/2014  . Absolute anemia 11/29/2014  . Speech disorder 11/29/2014  . Myocardial infarction (HCC) 11/29/2014  . Thrombocytopenia (HCC) 11/29/2014  . Cardiac arrest (HCC) 11/19/2014  . Multinodular goiter 04/03/2014  . HLD (hyperlipidemia) 10/01/2013  . BP (high blood pressure) 10/01/2013     Past Surgical History:  Procedure Laterality Date  . ABDOMINAL HYSTERECTOMY       Prior to Admission medications   Medication Sig Start Date End Date Taking? Authorizing Provider  amLODipine (NORVASC) 5 MG tablet Take 5 mg by mouth every morning. 01/25/15 01/25/16  [provider]  atorvastatin (LIPITOR) 80 MG tablet Take 80 mg by mouth at bedtime. 12/08/14 12/08/15  [provider]  EPINEPHrine 0.3 mg/0.3 mL IJ SOAJ injection Inject 0.3 mg into the muscle Once PRN.    [provider]  lisinopril (PRINIVIL,ZESTRIL) 20 MG tablet Take 20 mg by mouth daily. 01/20/15 01/20/16  [provider]  magnesium oxide (MAG-OX) 400 MG tablet Take 400 mg by mouth daily. 02/06/15   [provider]  metoprolol succinate (TOPROL-XL) 25 MG 24 hr tablet Take 25 mg by mouth daily. 12/08/14 12/08/15  [provider]  nitroGLYCERIN (NITROSTAT) 0.4 MG SL tablet Place 0.4 mg under the tongue every 5 (five) minutes x 3 doses as needed. For Chest Pain 12/08/14 12/08/15  [provider]  ticagrelor (BRILINTA) 90 MG TABS tablet Take 90 mg by mouth every 12 (twelve) hours. 12/19/14   [provider]     Allergies Other   No family history on file.  Social History Social History   Tobacco Use  . Smoking status: Never Smoker  . Smokeless tobacco: Never Used  Substance Use Topics  . Alcohol use: No  . Drug use: Not on file    Review of Systems  Constitutional:   No fever or chills.  ENT:   No sore throat. No rhinorrhea. Cardiovascular:   No chest pain or syncope. Respiratory:   No dyspnea or cough. Gastrointestinal:   Negative for abdominal pain, vomiting and diarrhea.  Musculoskeletal:   Negative for focal pain or swelling All other systems reviewed and are negative except as documented above in ROS and HPI.  ____________________________________________   PHYSICAL EXAM:  VITAL SIGNS: ED Triage Vitals  Enc Vitals Group     BP 08/28/18 1333 138/82  Pulse Rate 08/28/18 1333 82     Resp 08/28/18 1333 18     Temp 08/28/18 1333 98.8 F (37.1 C)     Temp Source 08/28/18 1333 Oral     SpO2 08/28/18 1333 98 %     Weight 08/28/18 1334 175 lb (79.4 kg)     Height 08/28/18 1334 5\' 3"  (1.6 m)     Head Circumference --      Peak Flow --      Pain Score 08/28/18 1334 3     Pain Loc --      Pain Edu? --      Excl. in [place]? --     Vital signs reviewed, nursing assessments reviewed.   Constitutional:   Alert and oriented.  Non-toxic appearance. Eyes:   Conjunctivae are normal. EOMI. PERRL. ENT      Head:   Normocephalic with sick centimeter linear laceration on the right forehead, oozing without pulsatile bleeding.      Nose:   No congestion/rhinnorhea.  No epistaxis      Mouth/Throat:   MMM, no pharyngeal erythema. No peritonsillar mass.  No oral injury      Neck:   No meningismus. Full ROM.  No midline spinal tenderness Hematological/Lymphatic/Immunilogical:   No cervical lymphadenopathy. Cardiovascular:   RRR. Symmetric bilateral radial and DP pulses.  No murmurs. Cap refill less than 2 seconds. Respiratory:   Normal respiratory effort without tachypnea/retractions. Breath sounds are clear and equal bilaterally. No wheezes/rales/rhonchi.  Musculoskeletal:   Normal range of motion in all extremities. No joint effusions.  No lower extremity tenderness.  No edema. Neurologic:   Normal speech and language.  Motor grossly intact. No acute focal neurologic deficits are appreciated.  Skin:    Skin is warm, dry and intact. No rash noted.  No petechiae, purpura, or bullae.  ____________________________________________    LABS (pertinent positives/negatives) (all labs ordered are listed, but only abnormal results are displayed) Labs Reviewed - No data to display ____________________________________________   EKG    ____________________________________________    RADIOLOGY  Ct Head Wo Contrast  Result Date: 08/28/2018 CLINICAL DATA:  Pain following trauma/fall EXAM: CT HEAD WITHOUT CONTRAST TECHNIQUE: Contiguous axial images were obtained from the base of the skull through the vertex without intravenous contrast. COMPARISON:  None. FINDINGS: Brain: The ventricles and sulci appear within normal limits. There is no intracranial mass, hemorrhage, extra-axial fluid collection, or midline shift. There is rather minimal decreased attenuation in the white matter adjacent to the frontal horn of the left lateral  ventricle, likely due to slight small vessel disease. Elsewhere brain parenchyma appears unremarkable. No evident acute infarct. Vascular: No hyperdense vessel. There is slight calcification in each carotid siphon region. Skull: The bony calvarium appears intact. There is a small right frontal scalp hematoma. A small amount of air is seen in the area of apparent soft tissue injury. Sinuses/Orbits: Visualized paranasal sinuses are clear. Visualized orbits appear symmetric bilaterally. Other: Visualized mastoid air cells are clear. IMPRESSION: 1. Right frontal scalp hematoma which contains air. Underlying bony calvarium intact. 2. Minimal periventricular small vessel disease. No mass or hemorrhage. No evident acute infarct. 3.  Slight arterial vascular calcification. wwwww Electronically Signed   By: Lowella Grip III M.D.   On: 08/28/2018 14:54    ____________________________________________   PROCEDURES .Marland KitchenLaceration Repair  Date/Time: 08/28/2018 3:23 PM Performed by: Carrie Mew, MD Authorized by: Carrie Mew, MD   Consent:    Consent obtained:  Verbal   Consent  given by:  Patient   Risks discussed:  Infection, pain, retained foreign body, poor cosmetic result and poor wound healing   Alternatives discussed:  No treatment Anesthesia (see MAR for exact dosages):    Anesthesia method:  Local infiltration   Local anesthetic:  Lidocaine 1% WITH epi Laceration details:    Location:  Scalp   Scalp location:  Frontal   Length (cm):  6 Repair type:    Repair type:  Simple Pre-procedure details:    Preparation:  Patient was prepped and draped in usual sterile fashion and imaging obtained to evaluate for foreign bodies Exploration:    Hemostasis achieved with:  Direct pressure and epinephrine   Wound exploration: entire depth of wound probed and visualized     Wound extent: no fascia violation noted, no foreign bodies/material noted, no underlying fracture noted and no vascular  damage noted     Contaminated: no   Treatment:    Area cleansed with:  Saline and Betadine   Amount of cleaning:  Extensive   Irrigation solution:  Sterile saline   Irrigation volume:  500   Irrigation method:  Pressure wash   Visualized foreign bodies/material removed: no   Skin repair:    Repair method:  Sutures   Suture size:  4-0   Wound skin closure material used: monocryl.   Suture technique:  Running   Number of sutures:  10 Approximation:    Approximation:  Close Post-procedure details:    Dressing:  Open (no dressing)   Patient tolerance of procedure:  Tolerated well, no immediate complications    ____________________________________________  DIFFERENTIAL DIAGNOSIS   Intracranial hemorrhage, mild TBI  CLINICAL IMPRESSION / ASSESSMENT AND PLAN / ED COURSE  Medications ordered in the ED: Medications  lidocaine-EPINEPHrine (XYLOCAINE W/EPI) 2 %-1:100000 (with pres) injection 20 mL (20 mLs Infiltration Given by Other 08/28/18 1347)  Tdap (BOOSTRIX) injection 0.5 mL (0.5 mLs Intramuscular Given 08/28/18 1516)    Pertinent labs & imaging results that were available during my care of the patient were reviewed by me and considered in my medical decision making (see chart for details).  Tereasa Coopamara K Venditto was evaluated in Emergency Department on 08/28/2018 for the symptoms described in the history of present illness. She was evaluated in the context of the global COVID-19 pandemic, which necessitated consideration that the patient might be at risk for infection with the SARS-CoV-2 virus that causes COVID-19. Institutional protocols and algorithms that pertain to the evaluation of patients at risk for COVID-19 are in a state of rapid change based on information released by regulatory bodies including the CDC and federal and state organizations. These policies and algorithms were followed during the patient's care in the ED.   Patient presents with forehead laceration after mechanical  fall.  Otherwise nontoxic, not in distress.  CT head is negative.  Tetanus updated.  Wound repaired after extensive cleaning, hemostatic.  Stable for discharge home.      ____________________________________________   FINAL CLINICAL IMPRESSION(S) / ED DIAGNOSES    Final diagnoses:  Injury of head, initial encounter  Laceration of forehead, initial encounter     ED Discharge Orders    None      Portions of this note were generated with dragon dictation software. Dictation errors may occur despite best attempts at proofreading.   Sharman CheekStafford, Quasean Frye, MD 08/28/18 1524

## 2018-08-28 NOTE — ED Notes (Addendum)
MD at bedside currently suturing a laceration on the pt's head. HR has been going into the 40's, blood pressure went from 107/95 to 69/39. Dr. Joni Fears notified, ordered a fluid bolus. Fluid bolus currently infusing free flow. This nurse at bedside with Jinny Blossom, [place] and Mickel Baas, [place]. Pt states a ringing in her ears, but does not note dizziness. Heart monitor applied at this time.

## 2018-10-07 ENCOUNTER — Other Ambulatory Visit: Payer: Self-pay | Admitting: Neurology

## 2018-10-07 DIAGNOSIS — R292 Abnormal reflex: Secondary | ICD-10-CM

## 2018-10-17 ENCOUNTER — Ambulatory Visit
Admission: RE | Admit: 2018-10-17 | Discharge: 2018-10-17 | Disposition: A | Discharge status: Home / Self Care | Payer: BC Managed Care – PPO | Source: Ambulatory Visit | Attending: Neurology | Admitting: Neurology

## 2018-10-17 DIAGNOSIS — R292 Abnormal reflex: Secondary | ICD-10-CM | POA: Insufficient documentation

## 2018-10-17 MED ORDER — GADOBUTROL 1 MMOL/ML IV SOLN
7.0000 mL | Freq: Once | INTRAVENOUS | Status: AC | PRN
  Administered 2018-10-17: 7 mL via INTRAVENOUS

## 2018-11-24 ENCOUNTER — Ambulatory Visit: Payer: BC Managed Care – PPO | Attending: Neurology

## 2018-11-24 ENCOUNTER — Other Ambulatory Visit: Payer: Self-pay

## 2018-11-24 DIAGNOSIS — M6281 Muscle weakness (generalized): Secondary | ICD-10-CM | POA: Diagnosis present

## 2018-11-24 DIAGNOSIS — R2689 Other abnormalities of gait and mobility: Secondary | ICD-10-CM | POA: Insufficient documentation

## 2018-11-24 NOTE — Therapy (Signed)
[facility] [address] [place], [place], [address] Phone: [phone]   Fax:  [phone]  Physical Therapy Evaluation  Patient Details  Name: Tereasa Coopamara K Millikan MRN: [id number] Date of Birth: [birth year] Referring Provider (PT): Cristopher PeruHemang Shah   Encounter Date: 11/24/2018  PT End of Session - 11/24/18 1604    Visit Number  1    Number of Visits  16    Date for PT Re-Evaluation  01/19/19    Authorization Type  eval 11/24/2018 next session 2/10 eval 11/3    PT Start Time  1605    PT Stop Time  1700    PT Time Calculation (min)  55 min    Equipment Utilized During Treatment  Gait belt    Activity Tolerance  Patient tolerated treatment well    Behavior During Therapy  WFL for tasks assessed/performed       Past Medical History:  Diagnosis Date  . Cardiac arrest (HCC)   . Hypertension     Past Surgical History:  Procedure Laterality Date  . ABDOMINAL HYSTERECTOMY      There were no vitals filed for this visit.   Subjective Assessment - 11/24/18 1603    Subjective  Patient with no complaints of pain, main complaint is recent falls.    Limitations  Standing;Walking;House hold activities;Lifting    How long can you sit comfortably?  NA    How long can you stand comfortably?  NA    How long can you walk comfortably?  NA    Patient Stated Goals  to decrease falls    Currently in Pain?  No/denies       vitals:  BP 128/68, SpO2 99, 56 HR    SUBJECTIVE Chief complaint: frequent falls Onset: March 2019 Imaging: none recently Recent changes in overall health/medication: No Directional pattern for falls: forward Prior history of physical therapy for balance: None Follow-up appointment with MD: Feb Red flags (bowel/bladder changes, saddle paresthesia, personal history of cancer, chills/fever, night sweats, unrelenting pain) Negative  OBJECTIVE  MUSCULOSKELETAL: Tremor: Absent Bulk: Normal Tone: Normal, no  clonus  Posture No gross abnormalities noted in standing or seated posture  Gait No gross abnormalities in gait noted  Strength R/L 5/4+ Hip flexion 4-/4- Hip extension  3+/4- Hip abduction 5/5 Hip adduction (in sitting, 3/5 bilaterally in sidelying testing position) 5/5 Knee extension 5/5 Knee flexion 5/5 Ankle Plantarflexion 4+/5 Ankle Dorsiflexion    NEUROLOGICAL:  Mental Status Patient is oriented to person, place and time.  Recent memory is intact.  Remote memory is intact.  Attention span and concentration are intact.  Expressive speech is intact.  Patient's fund of knowledge is within normal limits for educational level.  Cranial Nerves Visual acuity and visual fields are intact  Extraocular muscles are intact  Facial sensation is intact bilaterally  Facial strength is intact bilaterally  Hearing is normal as tested by gross conversation Shoulder shrug strength is intact  Tongue protrudes midline   Sensation Grossly intact to light touch bilateral UEs/LEs as determined by testing dermatomes C2-T2/L2-S2 respectively Proprioception and hot/cold testing deferred on this date     FUNCTIONAL OUTCOME MEASURES   Results Comments  BERG 56/56 Fall risk, in need of intervention  DGI 22/24   FGA 28/30   TUG 5.37 seconds   5TSTS 9 seconds            %   Minibest 20     POSTURAL CONTROL TESTS  Modified Clinical Test of Sensory Interaction for Balance    (CTSIB):  CONDITION TIME STRATEGY SWAY  Eyes open, firm surface 30 seconds ankle normal  Eyes closed, firm surface 30 seconds ankle normal  Eyes open, foam surface 30 seconds ankle normal  Eyes closed, foam surface 30 seconds ankle mild         Standardized Balance Assessment  Standardized Balance Assessment Dynamic Gait Index;Berg Balance Test;TUG;Five Times Sit to Stand;Mini-BESTest  Five times sit to stand comments  9  Berg Balance Test  Sit to Stand 4  Standing Unsupported 4  Sitting with  Back Unsupported but Feet Supported on Floor or Stool 4  Stand to Sit 4  Transfers 4  Standing Unsupported with Eyes Closed 4  Standing Unsupported with Feet Together 4  From Standing, Reach Forward with Outstretched Arm 4  From Standing Position, Pick up Object from Floor 4  From Standing Position, Turn to Look Behind Over each Shoulder 4  Turn 360 Degrees 4  Standing Unsupported, Alternately Place Feet on Step/Stool 4  Standing Unsupported, One Foot in Front 4  Standing on One Leg 4  Total Score 56  Mini-BESTest  Sit To Stand 2  Rise to Toes 2  Stand on one leg (left) 2 (second attempt able to perform)  Stand on one leg (right) 2 (second attempt able to perform)  Stand on one leg - lowest score 2  Compensatory Stepping Correction - Forward 0  Compensatory Stepping Correction - Backward 0  Compensatory Stepping Correction - Left Lateral 0  Compensatory Stepping Correction - Right Lateral 0  Stepping Corredtion Lateral - lowest score 0  Stance - Feet together, eyes open, firm surface  2  Stance - Feet together, eyes closed, foam surface  2  Incline - Eyes Closed 2  Change in Gait Speed 2  Walk with head turns - Horizontal 1  Walk with pivot turns 2  Step over obstacles 2  Timed UP & GO with Dual Task 1  Mini-BEST total score 20  Dynamic Gait Index  Level Surface 3  Change in Gait Speed 3  Gait with Horizontal Head Turns 2  Gait with Vertical Head Turns 2  Gait and Pivot Turn 3  Step Over Obstacle 3  Step Around Obstacles 3  Steps 3  Total Score 22  Functional Gait  Assessment  Gait assessed  Yes  Gait Level Surface 3  Change in Gait Speed 3  Gait with Horizontal Head Turns 2  Gait with Vertical Head Turns 2  Gait and Pivot Turn 3  Step Over Obstacle 3  Gait with Narrow Base of Support 3  Gait with Eyes Closed 3  Ambulating Backwards 3  Steps 3  Total Score 28     Objective measurements completed on examination: See above findings.   TREATMENT: Patient  administered HEP handout, verbalized understanding.   The patient is a 53 yo female that presented to the clinic for referral for frequent falls. The patient reported in the last several months she has fallen 3 times, stated often she falls forward, and has tripped over objects, but did have one fall due to uneven ground. PMH significant for anoxic brain injury in relation to a cardiac event. Upon assessment the patient demonstrated mild deficits in bilateral hip strength, single leg stance balance, and BERG, DGI, FGA, 5 times sit to stand, and TUG all WFLS. However, the patient demonstrated significantly impaired stepping strategies when performing the miniBest outcome measure, indicating a decreased  reaction time that affects her safety. These deficits increase the patient's risk of falls and would benefit from further skilled PT intervention.           PT Education - 11/24/18 1603    Education Details  therex technique, PT role, POC, expectations, HEP    Person(s) Educated  Patient    Methods  Explanation    Comprehension  Verbalized understanding;Verbal cues required;Returned demonstration;Tactile cues required;Need further instruction       PT Short Term Goals - 11/25/18 1512      PT SHORT TERM GOAL #1   Title  The patient will report compliance with initial HEP to demonstrate ability to manage condition at home.    Baseline  administered HEP  11/3    Time  2    Period  Weeks    Status  New    Target Date  12/08/18        PT Long Term Goals - 11/25/18 1513      PT LONG TERM GOAL #1   Title  the patient will report compliance with finalized HEP in preparation for self management of condition    Time  4    Period  Weeks    Status  New    Target Date  12/22/18      PT LONG TERM GOAL #2   Title  The patient will demonstrate 5/5 for bilateral hip strength to improve balance and ability to perform functional activities    Baseline  see eval for details (overall decreased  hip abduction/adduction/extension strength bilaterally)    Time  4    Period  Weeks    Target Date  12/22/18      PT LONG TERM GOAL #3   Title  the patient will report performing step over step stair navigation at home to indicate improved ability to perform functional activity.    Time  4    Period  Weeks    Status  New    Target Date  12/22/18      PT LONG TERM GOAL #4   Title  the patient will demonstrate an improved ability to perform compensatory strategies to increase safety at home and decrease risk of falls;    Baseline  minibest: unable to perform stepping strategies/catch herself for forward/backward/lateral.    Time  4    Period  Weeks    Status  New    Target Date  12/22/18      PT LONG TERM GOAL #5   Title  The patient will perform SLS bilaterally >10secs indicating a decreased risk of falls    Baseline  R SLS: 3 seconds, L SLS: 10seconds    Time  4    Period  Weeks    Status  New    Target Date  12/23/18             Plan - 11/24/18 1604    Clinical Impression Statement  The patient is a 53 yo female that presented to the clinic for referral for frequent falls. The patient reported in the last several months she has fallen 3 times, stated often she falls forward, and has tripped over objects, but did have one fall due to uneven ground. PMH significant for anoxic brain injury in relation to a cardiac event. Upon assessment the patient demonstrated mild deficits in bilateral hip strength, single leg stance balance, and BERG, DGI, FGA, 5 times sit to stand, and TUG all WFLS. However, the  patient demonstrated significantly impaired stepping strategies when performing the miniBest outcome measure, indicating a decreased reaction time that affects her safety. These deficits increase the patient's risk of falls and would benefit from further skilled PT intervention.    Personal Factors and Comorbidities  Fitness    Examination-Activity Limitations  Other   ambulation,  reaction time to respond to external stimuli   Examination-Participation Restrictions  Church;Interpersonal Relationship;Yard Work;Community Activity;Shop    Stability/Clinical Decision Making  Stable/Uncomplicated    Clinical Decision Making  Low    Rehab Potential  Fair    PT Frequency  1x / week    PT Duration  4 weeks    PT Treatment/Interventions  ADLs/Self Care Home Management;Cryotherapy;Traction;Gait training;Therapeutic exercise;Patient/family education;Manual techniques;Passive range of motion;Spinal Manipulations;Joint Manipulations;Vestibular;Dry needling;Orthotic Fit/Training;Balance training;Stair training;Electrical Stimulation;Ultrasound;Aquatic Therapy;Iontophoresis 4mg /ml Dexamethasone;Functional mobility training;Neuromuscular re-education;Energy conservation;Splinting;Taping;Wheelchair mobility training;Therapeutic activities;DME Instruction;Moist Heat;Canalith Repostioning    PT Next Visit Plan  compensatory strategies, power/explosive training    PT Home Exercise Plan  medbridge, see pt instruction section (hip abduction sidelying, SLS, tandem, tandem eyes closed)    Consulted and Agree with Plan of Care  Patient       Patient will benefit from skilled therapeutic intervention in order to improve the following deficits and impairments:  Decreased strength, Decreased balance, Other (comment)(compensatory strategies)  Visit Diagnosis: Other abnormalities of gait and mobility - Plan: PT plan of care cert/re-cert  Muscle weakness (generalized) - Plan: PT plan of care cert/re-cert     Problem List Patient Active Problem List   Diagnosis Date Noted  . Cerebral anoxic injury ([place]) 12/18/2014  . CAD in native artery 12/14/2014  . Abnormal thyroid stimulating hormone (TSH) level 11/29/2014  . Absolute anemia 11/29/2014  . Speech disorder 11/29/2014  . Myocardial infarction ([place]) 11/29/2014  . Thrombocytopenia ([place]) 11/29/2014  . Cardiac arrest ([place]) 11/19/2014  .  Multinodular goiter 04/03/2014  . HLD (hyperlipidemia) 10/01/2013  . BP (high blood pressure) 10/01/2013   Lieutenant Diego PT, DPT 4:37 PM,11/25/18 [phone]  [facility] MAIN [facility] SERVICES [address] [place], [place], [address] Phone: [phone]   Fax:  [phone]  Name: AURIAH HOLLINGS MRN: [id number] Date of Birth: 1965-03-21

## 2018-11-24 NOTE — Patient Instructions (Signed)
Access Code: [email or web address]  URL: [email or web address]  Date: 11/24/2018  Prepared by: Lieutenant Diego   Exercises Single Leg Stance - 3 reps - 1 sets - 20 hold - 1x daily - 4x weekly Tandem Walking with Counter Support - 15 reps - 1x daily - 4x weekly Tandem Stance with Eyes Closed in Corner - 10 reps - 1x daily - 4x weekly Sidelying Hip Abduction - 10 reps - 3 sets - 1x daily - 4x weekly

## 2018-11-27 ENCOUNTER — Ambulatory Visit: Payer: BC Managed Care – PPO

## 2018-12-01 ENCOUNTER — Ambulatory Visit: Payer: BC Managed Care – PPO

## 2018-12-01 ENCOUNTER — Other Ambulatory Visit: Payer: Self-pay

## 2018-12-01 DIAGNOSIS — R2689 Other abnormalities of gait and mobility: Secondary | ICD-10-CM | POA: Diagnosis not present

## 2018-12-01 DIAGNOSIS — M6281 Muscle weakness (generalized): Secondary | ICD-10-CM

## 2018-12-01 NOTE — Therapy (Signed)
[place] [facility] MAIN [facility] SERVICES [address] [place], [place], [address] Phone: [phone]   Fax:  [phone]  Physical Therapy Treatment  Patient Details  Name: Sarah Bowen MRN: [id number] Date of Birth: November 15, 1965 Referring Provider (PT): Cristopher Peru   Encounter Date: 12/01/2018  PT End of Session - 12/01/18 1707    Visit Number  2    Number of Visits  16    Date for PT Re-Evaluation  01/19/19    Authorization Type  eval 11/24/2018 next session 2/10 eval 11/3    PT Start Time  1608    PT Stop Time  1648    PT Time Calculation (min)  40 min    Equipment Utilized During Treatment  Gait belt    Activity Tolerance  Patient tolerated treatment well    Behavior During Therapy  WFL for tasks assessed/performed       Past Medical History:  Diagnosis Date  . Cardiac arrest (HCC)   . Hypertension     Past Surgical History:  Procedure Laterality Date  . ABDOMINAL HYSTERECTOMY      There were no vitals filed for this visit.  Subjective Assessment - 12/01/18 1705    Subjective  Patient reports no falls or LOB since last session. Has been compliant with HEP. Came here from work.    Limitations  Standing;Walking;House hold activities;Lifting    How long can you sit comfortably?  NA    How long can you stand comfortably?  NA    How long can you walk comfortably?  NA    Patient Stated Goals  to decrease falls    Currently in Pain?  No/denies          Vitals: 124/73 pulse 50   limited compensatory mechanics-needs help to practice stepping strategies   Treatment:  Resisted walking: 12.5 lb , 3x each direction, forward, lateral, backwards; challenged with stability of LLE. Two near LOB with LLE stability   Red light green light in hallway for initiation/termination of movement, occasional near LOB with LLE single limb stance.   Airex pad:  -Static stand with one LE on airex pad one LE on 7" step. 45 second holds x 2 trials each  position -Static stand with horizontal head turns 60 seconds, reducing to narrow BOS for increased difficulty x 60 seconds x 2 trials  -Static stand with vertical head turns 60 seconds, reducing to narrow BOS for increased difficulty x 60 seconds x 2 trials  -tandem stance 60 seconds x2 trials with each LE back.   Trampoline:  -small amplitude df/pf 20x with decreasing UE support from BUE to SUE to no UE -small amplitude single LE df/pf with decreasing UE support from BUE to SUE support 20x each UE  Single leg sit to stand from raised plinth table with BUE support  Pt educated throughout session about proper posture and technique with exercises. Improved exercise technique, movement at target joints, use of target muscles after min to mod verbal, visual, tactile cues                      PT Education - 12/01/18 1705    Education Details  stability, need to wear pants next session, ankle righting reactions    Person(s) Educated  Patient    Methods  Explanation;Demonstration;Tactile cues;Verbal cues    Comprehension  Verbalized understanding;Returned demonstration;Verbal cues required;Tactile cues required       PT Short Term Goals -  11/25/18 1512      PT SHORT TERM GOAL #1   Title  The patient will report compliance with initial HEP to demonstrate ability to manage condition at home.    Baseline  administered HEP  11/3    Time  2    Period  Weeks    Status  New    Target Date  12/08/18        PT Long Term Goals - 11/25/18 1513      PT LONG TERM GOAL #1   Title  the patient will report compliance with finalized HEP in preparation for self management of condition    Time  4    Period  Weeks    Status  New    Target Date  12/22/18      PT LONG TERM GOAL #2   Title  The patient will demonstrate 5/5 for bilateral hip strength to improve balance and ability to perform functional activities    Baseline  see eval for details (overall decreased hip  abduction/adduction/extension strength bilaterally)    Time  4    Period  Weeks    Target Date  12/22/18      PT LONG TERM GOAL #3   Title  the patient will report performing step over step stair navigation at home to indicate improved ability to perform functional activity.    Time  4    Period  Weeks    Status  New    Target Date  12/22/18      PT LONG TERM GOAL #4   Title  the patient will demonstrate an improved ability to perform compensatory strategies to increase safety at home and decrease risk of falls;    Baseline  minibest: unable to perform stepping strategies/catch herself for forward/backward/lateral.    Time  4    Period  Weeks    Status  New    Target Date  12/22/18      PT LONG TERM GOAL #5   Title  The patient will perform SLS bilaterally >10secs indicating a decreased risk of falls    Baseline  R SLS: 3 seconds, L SLS: 10seconds    Time  4    Period  Weeks    Status  New    Target Date  12/23/18            Plan - 12/01/18 1709    Clinical Impression Statement  Patient presents to physical therapy with excellent motivation throughout session. Patient educated on need to wear pants to next session for progression of compensatory step interventions and power interventions. Patient demonstrates decreased stability and ankle righting reactions of LLE>RLE. Patient will benefit from continued skilled physical therapy for improved stability, strength, and decreased fall risk.    Personal Factors and Comorbidities  Fitness    Examination-Activity Limitations  Other   ambulation, reaction time to respond to external stimuli   Examination-Participation Restrictions  Church;Interpersonal Relationship;Yard Work;Community Activity;Shop    Stability/Clinical Decision Making  Stable/Uncomplicated    Rehab Potential  Fair    PT Frequency  1x / week    PT Duration  4 weeks    PT Treatment/Interventions  ADLs/Self Care Home Management;Cryotherapy;Traction;Gait  training;Therapeutic exercise;Patient/family education;Manual techniques;Passive range of motion;Spinal Manipulations;Joint Manipulations;Vestibular;Dry needling;Orthotic Fit/Training;Balance training;Stair training;Electrical Stimulation;Ultrasound;Aquatic Therapy;Iontophoresis 4mg /ml Dexamethasone;Functional mobility training;Neuromuscular re-education;Energy conservation;Splinting;Taping;Wheelchair mobility training;Therapeutic activities;DME Instruction;Moist Heat;Canalith Repostioning    PT Next Visit Plan  compensatory strategies, power/explosive training    PT Home Exercise Plan  medbridge, see pt instruction section (hip abduction sidelying, SLS, tandem, tandem eyes closed)    Consulted and Agree with Plan of Care  Patient       Patient will benefit from skilled therapeutic intervention in order to improve the following deficits and impairments:  Decreased strength, Decreased balance, Other (comment)(compensatory strategies)  Visit Diagnosis: Other abnormalities of gait and mobility  Muscle weakness (generalized)     Problem List Patient Active Problem List   Diagnosis Date Noted  . Cerebral anoxic injury (HCC) 12/18/2014  . CAD in native artery 12/14/2014  . Abnormal thyroid stimulating hormone (TSH) level 11/29/2014  . Absolute anemia 11/29/2014  . Speech disorder 11/29/2014  . Myocardial infarction (HCC) 11/29/2014  . Thrombocytopenia (HCC) 11/29/2014  . Cardiac arrest (HCC) 11/19/2014  . Multinodular goiter 04/03/2014  . HLD (hyperlipidemia) 10/01/2013  . BP (high blood pressure) 10/01/2013   Precious Bard, PT, DPT   12/01/2018, 5:10 PM  [place] [facility] MAIN [facility] SERVICES [address] [place], [place], [address] Phone: [phone]   Fax:  [phone]  Name: Sarah Bowen MRN: [id number] Date of Birth: 18-Oct-1965

## 2018-12-03 ENCOUNTER — Ambulatory Visit: Payer: BC Managed Care – PPO

## 2018-12-08 ENCOUNTER — Ambulatory Visit: Payer: BC Managed Care – PPO

## 2018-12-08 ENCOUNTER — Other Ambulatory Visit: Payer: Self-pay

## 2018-12-08 DIAGNOSIS — R2689 Other abnormalities of gait and mobility: Secondary | ICD-10-CM

## 2018-12-08 DIAGNOSIS — M6281 Muscle weakness (generalized): Secondary | ICD-10-CM

## 2018-12-08 NOTE — Therapy (Signed)
[facility] MAIN [facility] SERVICES [address] [place], [place], [address] Phone: [phone]   Fax:  [phone]  Physical Therapy Treatment  Patient Details  Name: Sarah Bowen MRN: [id number] Date of Birth: 1965-06-24 Referring Provider (PT): Cristopher Peru   Encounter Date: 12/08/2018  PT End of Session - 12/08/18 1755    Visit Number  3    Number of Visits  16    Date for PT Re-Evaluation  01/19/19    Authorization Type  eval 11/24/2018 next session 3/10 eval 11/3    PT Start Time  1600    PT Stop Time  1643    PT Time Calculation (min)  43 min    Equipment Utilized During Treatment  Gait belt    Activity Tolerance  Patient tolerated treatment well    Behavior During Therapy  WFL for tasks assessed/performed       Past Medical History:  Diagnosis Date  . Cardiac arrest (HCC)   . Hypertension     Past Surgical History:  Procedure Laterality Date  . ABDOMINAL HYSTERECTOMY      There were no vitals filed for this visit.  Subjective Assessment - 12/08/18 1754    Subjective  Patient reports compliance with HEP, no falls or LOB since last session.    Limitations  Standing;Walking;House hold activities;Lifting    How long can you sit comfortably?  NA    How long can you stand comfortably?  NA    How long can you walk comfortably?  NA    Patient Stated Goals  to decrease falls    Currently in Pain?  No/denies          Stepping strategy In // bars push and catch self Single leg leg press    in // bars: airex pad: one foot on airex pad one foot on 4' step hold 30 seconds with cueing from mirror and PT for midline posturing 2x each LE position, more challenged with LLE  airex pad: pertubation's in all directions x 2 minutes airex pad: pertubation's with eyes closed in all directions x 2 minutes, very challenging to patient.  Rocker board: forward/posterior tilt 15x BUE support each LE position, 15x SUE support each LE each  position: challenging for SUE support Half foam roller: flat side up, df/pf 2 minutes, cueing for neutral hip alignment for maximal calf/ankle lengthening/contractions Bosu ball; blue side up; modified lunges for stepping strategy no UE support 10x each LE Bosu ball: blue side up; modified lateral lunges for stepping strategy no UE support  Bosu ball ;flat side up: static balance x3 minute with decreasing UE support, very challenging for patient  Single leg sit to stand with dynadisc under RLE 10x Single leg sit to stand from raised plinth table 10x LLE, very challenging to patient Sit to stand power jump with focus on eccentric control of sitting down x10  Seated: RTB ER 10x LLE RTB IR 10x LLE    Pt educated throughout session about proper posture and technique with exercises. Improved exercise technique, movement at target joints, use of target muscles after min to mod verbal, visual, tactile cues.                   PT Education - 12/08/18 1755    Education Details  stability, body mechanics, power    Person(s) Educated  Patient    Methods  Explanation;Demonstration;Tactile cues;Verbal cues    Comprehension  Verbalized understanding;Returned demonstration;Verbal cues required;Tactile  cues required       PT Short Term Goals - 11/25/18 1512      PT SHORT TERM GOAL #1   Title  The patient will report compliance with initial HEP to demonstrate ability to manage condition at home.    Baseline  administered HEP  11/3    Time  2    Period  Weeks    Status  New    Target Date  12/08/18        PT Long Term Goals - 11/25/18 1513      PT LONG TERM GOAL #1   Title  the patient will report compliance with finalized HEP in preparation for self management of condition    Time  4    Period  Weeks    Status  New    Target Date  12/22/18      PT LONG TERM GOAL #2   Title  The patient will demonstrate 5/5 for bilateral hip strength to improve balance and ability to  perform functional activities    Baseline  see eval for details (overall decreased hip abduction/adduction/extension strength bilaterally)    Time  4    Period  Weeks    Target Date  12/22/18      PT LONG TERM GOAL #3   Title  the patient will report performing step over step stair navigation at home to indicate improved ability to perform functional activity.    Time  4    Period  Weeks    Status  New    Target Date  12/22/18      PT LONG TERM GOAL #4   Title  the patient will demonstrate an improved ability to perform compensatory strategies to increase safety at home and decrease risk of falls;    Baseline  minibest: unable to perform stepping strategies/catch herself for forward/backward/lateral.    Time  4    Period  Weeks    Status  New    Target Date  12/22/18      PT LONG TERM GOAL #5   Title  The patient will perform SLS bilaterally >10secs indicating a decreased risk of falls    Baseline  R SLS: 3 seconds, L SLS: 10seconds    Time  4    Period  Weeks    Status  New    Target Date  12/23/18            Plan - 12/08/18 1759    Clinical Impression Statement  Patient educated on ankle righting reactions and steppage reactions for improved stability with stable and unstable surfaces with and without pertubation's. Left LE continues to be more challenged with stability interventions compared to right at this time. Patient will benefit from continued skilled physical therapy for improved stability, strength, and decreased fall risk.    Personal Factors and Comorbidities  Fitness    Examination-Activity Limitations  Other   ambulation, reaction time to respond to external stimuli   Examination-Participation Restrictions  Church;Interpersonal Relationship;Yard Work;Community Activity;Shop    Stability/Clinical Decision Making  Stable/Uncomplicated    Rehab Potential  Fair    PT Frequency  1x / week    PT Duration  4 weeks    PT Treatment/Interventions  ADLs/Self Care  Home Management;Cryotherapy;Traction;Gait training;Therapeutic exercise;Patient/family education;Manual techniques;Passive range of motion;Spinal Manipulations;Joint Manipulations;Vestibular;Dry needling;Orthotic Fit/Training;Balance training;Stair training;Electrical Stimulation;Ultrasound;Aquatic Therapy;Iontophoresis 4mg /ml Dexamethasone;Functional mobility training;Neuromuscular re-education;Energy conservation;Splinting;Taping;Wheelchair mobility training;Therapeutic activities;DME Instruction;Moist Heat;Canalith Repostioning    PT Next Visit Plan  compensatory  strategies, power/explosive training    PT Home Exercise Plan  medbridge, see pt instruction section (hip abduction sidelying, SLS, tandem, tandem eyes closed)    Consulted and Agree with Plan of Care  Patient       Patient will benefit from skilled therapeutic intervention in order to improve the following deficits and impairments:  Decreased strength, Decreased balance, Other (comment)(compensatory strategies)  Visit Diagnosis: Other abnormalities of gait and mobility  Muscle weakness (generalized)     Problem List Patient Active Problem List   Diagnosis Date Noted  . Cerebral anoxic injury ([place]) 12/18/2014  . CAD in native artery 12/14/2014  . Abnormal thyroid stimulating hormone (TSH) level 11/29/2014  . Absolute anemia 11/29/2014  . Speech disorder 11/29/2014  . Myocardial infarction ([place]) 11/29/2014  . Thrombocytopenia ([place]) 11/29/2014  . Cardiac arrest ([place]) 11/19/2014  . Multinodular goiter 04/03/2014  . HLD (hyperlipidemia) 10/01/2013  . BP (high blood pressure) 10/01/2013   Janna Arch, PT, DPT   12/08/2018, 6:00 PM  [place] MAIN [facility] SERVICES [address] [place], [place], [address] Phone: [phone]   Fax:  [phone]  Name: AVAIYAH STRUBEL MRN: [id number] Date of Birth: December 12, 1965

## 2018-12-10 ENCOUNTER — Ambulatory Visit: Payer: BC Managed Care – PPO

## 2018-12-15 ENCOUNTER — Ambulatory Visit: Payer: BC Managed Care – PPO

## 2018-12-15 ENCOUNTER — Other Ambulatory Visit: Payer: Self-pay

## 2018-12-15 DIAGNOSIS — R2689 Other abnormalities of gait and mobility: Secondary | ICD-10-CM

## 2018-12-15 DIAGNOSIS — M6281 Muscle weakness (generalized): Secondary | ICD-10-CM

## 2018-12-15 NOTE — Therapy (Signed)
[place] MAIN [facility] SERVICES [address] [place], [place], [address] Phone: [phone]   Fax:  [phone]  Physical Therapy Treatment  Patient Details  Name: Sarah Bowen MRN: [id number] Date of Birth: 1965-12-23 Referring Provider (PT): Jennings Books   Encounter Date: 12/15/2018  PT End of Session - 12/15/18 1800    Visit Number  4    Number of Visits  16    Date for PT Re-Evaluation  01/19/19    Authorization Type  eval 11/24/2018 next session 4/10 eval 11/3    PT Start Time  1515    PT Stop Time  1559    PT Time Calculation (min)  44 min    Equipment Utilized During Treatment  Gait belt    Activity Tolerance  Patient tolerated treatment well    Behavior During Therapy  WFL for tasks assessed/performed       Past Medical History:  Diagnosis Date  . Cardiac arrest ([place])   . Hypertension     Past Surgical History:  Procedure Laterality Date  . ABDOMINAL HYSTERECTOMY      There were no vitals filed for this visit.  Subjective Assessment - 12/15/18 1517    Subjective  Patient reports no falls or LOB, no pain or complaints at this time.    Limitations  Standing;Walking;House hold activities;Lifting    How long can you sit comfortably?  NA    How long can you stand comfortably?  NA    How long can you walk comfortably?  NA    Patient Stated Goals  to decrease falls    Currently in Pain?  No/denies          TRX: Posterior lunge: initial cueing for posterior step with lengthening of arms 15x each side, very challenging for LLE coordination  Squat 20x with max cueing for body mechanics and sequencing Lateral lunge to single limb modified ; limited/challenged with LLE. 12x each side  Hedgehog taps: 3 hedgehogs with intermittent toe touch between taps, able to progress to tapping laterally and forward consecutively but unable to tap cross body without stabilization 10x each LE  Dynadisc: clockwise 15x, counterclockwise  15x  RTB seated ankle: inversion 10x, eversion 10x, df 10x, pf 10x   Sit to stand no UE support 10x focus on breathing  Sit to stand with airex pad under feet 10x, very challenging initially, progressed with repetition     Access Code: [email or web address]  URL: [email or web address]  Date: 12/15/2018  Prepared by: Janna Arch   Exercises Seated Ankle Dorsiflexion with Resistance - 10 reps - 2 sets - 5 hold - 1x daily - 7x weekly Seated Ankle Inversion with Anchored Resistance - 10 reps - 2 sets - 5 hold - 1x daily - 7x weekly Seated Ankle Eversion with Anchored Resistance - 10 reps - 2 sets - 5 hold - 1x daily - 7x weekly Seated Ankle Plantar Flexion with Resistance Loop - 10 reps - 2 sets - 5 hold - 1x daily - 7x weekly      Pt educated throughout session about proper posture and technique with exercises. Improved exercise technique, movement at target joints, use of target muscles after min to mod verbal, visual, tactile cues.  Patient presents with excellent motivation to today's physical therapy session. New Hep added to current program with a focus on ankle stability and strength with LLE. Patient is challenged on unstable surfaces and in single limb position with noted limited ankle righting reactions  when fatigued.  Patient will benefit from continued skilled physical therapy for improved stability, strength, and decreased fall risk.                    PT Education - 12/15/18 1756    Education Details  exercise technique, ankle HEP    Person(s) Educated  Patient    Methods  Explanation;Demonstration;Tactile cues;Verbal cues;Handout    Comprehension  Verbalized understanding;Returned demonstration;Verbal cues required;Tactile cues required       PT Short Term Goals - 11/25/18 1512      PT SHORT TERM GOAL #1   Title  The patient will report compliance with initial HEP to demonstrate ability to manage condition at home.    Baseline  administered HEP   11/3    Time  2    Period  Weeks    Status  New    Target Date  12/08/18        PT Long Term Goals - 11/25/18 1513      PT LONG TERM GOAL #1   Title  the patient will report compliance with finalized HEP in preparation for self management of condition    Time  4    Period  Weeks    Status  New    Target Date  12/22/18      PT LONG TERM GOAL #2   Title  The patient will demonstrate 5/5 for bilateral hip strength to improve balance and ability to perform functional activities    Baseline  see eval for details (overall decreased hip abduction/adduction/extension strength bilaterally)    Time  4    Period  Weeks    Target Date  12/22/18      PT LONG TERM GOAL #3   Title  the patient will report performing step over step stair navigation at home to indicate improved ability to perform functional activity.    Time  4    Period  Weeks    Status  New    Target Date  12/22/18      PT LONG TERM GOAL #4   Title  the patient will demonstrate an improved ability to perform compensatory strategies to increase safety at home and decrease risk of falls;    Baseline  minibest: unable to perform stepping strategies/catch herself for forward/backward/lateral.    Time  4    Period  Weeks    Status  New    Target Date  12/22/18      PT LONG TERM GOAL #5   Title  The patient will perform SLS bilaterally >10secs indicating a decreased risk of falls    Baseline  R SLS: 3 seconds, L SLS: 10seconds    Time  4    Period  Weeks    Status  New    Target Date  12/23/18            Plan - 12/15/18 1801    Clinical Impression Statement  Patient presents with excellent motivation to today's physical therapy session. New Hep added to current program with a focus on ankle stability and strength with LLE. Patient is challenged on unstable surfaces and in single limb position with noted limited ankle righting reactions when fatigued.  Patient will benefit from continued skilled physical therapy for  improved stability, strength, and decreased fall risk.    Personal Factors and Comorbidities  Fitness    Examination-Activity Limitations  Other   ambulation, reaction time to respond to external stimuli  Examination-Participation Restrictions  Church;Interpersonal Relationship;Yard Work;Community Activity;Shop    Stability/Clinical Decision Making  Stable/Uncomplicated    Rehab Potential  Fair    PT Frequency  1x / week    PT Duration  4 weeks    PT Treatment/Interventions  ADLs/Self Care Home Management;Cryotherapy;Traction;Gait training;Therapeutic exercise;Patient/family education;Manual techniques;Passive range of motion;Spinal Manipulations;Joint Manipulations;Vestibular;Dry needling;Orthotic Fit/Training;Balance training;Stair training;Electrical Stimulation;Ultrasound;Aquatic Therapy;Iontophoresis 4mg /ml Dexamethasone;Functional mobility training;Neuromuscular re-education;Energy conservation;Splinting;Taping;Wheelchair mobility training;Therapeutic activities;DME Instruction;Moist Heat;Canalith Repostioning    PT Next Visit Plan  compensatory strategies, power/explosive training    PT Home Exercise Plan  medbridge, see pt instruction section (hip abduction sidelying, SLS, tandem, tandem eyes closed)    Consulted and Agree with Plan of Care  Patient       Patient will benefit from skilled therapeutic intervention in order to improve the following deficits and impairments:  Decreased strength, Decreased balance, Other (comment)(compensatory strategies)  Visit Diagnosis: Other abnormalities of gait and mobility  Muscle weakness (generalized)     Problem List Patient Active Problem List   Diagnosis Date Noted  . Cerebral anoxic injury (HCC) 12/18/2014  . CAD in native artery 12/14/2014  . Abnormal thyroid stimulating hormone (TSH) level 11/29/2014  . Absolute anemia 11/29/2014  . Speech disorder 11/29/2014  . Myocardial infarction (HCC) 11/29/2014  . Thrombocytopenia (HCC)  11/29/2014  . Cardiac arrest (HCC) 11/19/2014  . Multinodular goiter 04/03/2014  . HLD (hyperlipidemia) 10/01/2013  . BP (high blood pressure) 10/01/2013   12/01/2013, PT, DPT   12/15/2018, 6:01 PM  [place] [facility] MAIN [facility] SERVICES [address] [place], [place], [place] Phone: [phone]   Fax:  [phone]  Name: Sarah Bowen MRN: [id number] Coop Date of Birth: 1966/01/18

## 2018-12-22 ENCOUNTER — Other Ambulatory Visit: Payer: Self-pay

## 2018-12-22 ENCOUNTER — Ambulatory Visit: Payer: BC Managed Care – PPO | Attending: Neurology

## 2018-12-22 ENCOUNTER — Ambulatory Visit: Payer: BC Managed Care – PPO

## 2018-12-22 DIAGNOSIS — M6281 Muscle weakness (generalized): Secondary | ICD-10-CM | POA: Insufficient documentation

## 2018-12-22 DIAGNOSIS — R2689 Other abnormalities of gait and mobility: Secondary | ICD-10-CM | POA: Diagnosis not present

## 2018-12-22 NOTE — Therapy (Signed)
[facility] MAIN [facility] SERVICES [address] [place], [place], [address] Phone: [phone]   Fax:  [phone]  Physical Therapy Treatment  Patient Details  Name: Sarah Bowen MRN: [id number] Date of Birth: [birth year] Referring Provider (PT): Cristopher Peru   Encounter Date: 12/22/2018  PT End of Session - 12/22/18 1705    Visit Number  5    Number of Visits  16    Date for PT Re-Evaluation  01/19/19    Authorization Type  eval 11/24/2018 next session 5/10 eval 11/3    PT Start Time  1602    PT Stop Time  1645    PT Time Calculation (min)  43 min    Equipment Utilized During Treatment  Gait belt    Activity Tolerance  Patient tolerated treatment well    Behavior During Therapy  WFL for tasks assessed/performed       Past Medical History:  Diagnosis Date  . Cardiac arrest (HCC)   . Hypertension     Past Surgical History:  Procedure Laterality Date  . ABDOMINAL HYSTERECTOMY      There were no vitals filed for this visit.  Subjective Assessment - 12/22/18 1704    Subjective  Patient reports compliance with HEP, no falls or LOB since last session. No pain reported this session.    Limitations  Standing;Walking;House hold activities;Lifting    How long can you sit comfortably?  NA    How long can you stand comfortably?  NA    How long can you walk comfortably?  NA    Patient Stated Goals  to decrease falls    Currently in Pain?  No/denies         Stair negotiation cueing for widening BOS: patient initially performs with narrow BOS with UE support, with cueing she is able to perform with widened step BOS and without UE support.   4' step eccentric step down; challenged with stabilizing on RLE, challenged with heel tap portion of L foot. Initially requires use of UE support, able to transition to single finger support.   airex pad: 6" step modified tandem stance with horizontal head turns 2x 30 second holds; challenging with head  turns.   hedgehog: forward, lateral, posterior taps with decreasing toe down for stability, challenging with LLE, 10x each side  [place] modified lunges: forward, lateral, backwards, cueing for weight shift outside BOS and return to COM, challenging laterally to patient. 10x each LE   airex pad: sit to stands hands on knees, focus on body mechanics and stability  Sit to stand with GTB around knees for lateral tactile cueing 10x  Speed ladder: -one foot in each square 2x length of // bars -lateral stepping two feet in one square 2x length of // bars -in in/out out diagonal movement, 4x length of ladder, challenging with stability on toes with coordination -lateral/sideways in in/out out 4x length of ladder, challenging for LLE     Patient's session was focused on stability and coordination with patient being challenged with LLE spatial awareness and coordination. She demonstrates improved dynamic stability with decreased episodes of instability with movements allowing progression of interventions. Patient will benefit from continued skilled physical therapy for improved stability, strength, and decreased fall risk.                   PT Education - 12/22/18 1705    Education Details  exercise technique, coordination    Person(s) Educated  Patient    Methods  Explanation;Demonstration;Tactile cues;Verbal cues    Comprehension  Verbalized understanding;Returned demonstration;Verbal cues required;Tactile cues required       PT Short Term Goals - 11/25/18 1512      PT SHORT TERM GOAL #1   Title  The patient will report compliance with initial HEP to demonstrate ability to manage condition at home.    Baseline  administered HEP  11/3    Time  2    Period  Weeks    Status  New    Target Date  12/08/18        PT Long Term Goals - 11/25/18 1513      PT LONG TERM GOAL #1   Title  the patient will report compliance with finalized HEP in preparation for self  management of condition    Time  4    Period  Weeks    Status  New    Target Date  12/22/18      PT LONG TERM GOAL #2   Title  The patient will demonstrate 5/5 for bilateral hip strength to improve balance and ability to perform functional activities    Baseline  see eval for details (overall decreased hip abduction/adduction/extension strength bilaterally)    Time  4    Period  Weeks    Target Date  12/22/18      PT LONG TERM GOAL #3   Title  the patient will report performing step over step stair navigation at home to indicate improved ability to perform functional activity.    Time  4    Period  Weeks    Status  New    Target Date  12/22/18      PT LONG TERM GOAL #4   Title  the patient will demonstrate an improved ability to perform compensatory strategies to increase safety at home and decrease risk of falls;    Baseline  minibest: unable to perform stepping strategies/catch herself for forward/backward/lateral.    Time  4    Period  Weeks    Status  New    Target Date  12/22/18      PT LONG TERM GOAL #5   Title  The patient will perform SLS bilaterally >10secs indicating a decreased risk of falls    Baseline  R SLS: 3 seconds, L SLS: 10seconds    Time  4    Period  Weeks    Status  New    Target Date  12/23/18            Plan - 12/22/18 1707    Clinical Impression Statement  Patient's session was focused on stability and coordination with patient being challenged with LLE spatial awareness and coordination. She demonstrates improved dynamic stability with decreased episodes of instability with movements allowing progression of interventions. Patient will benefit from continued skilled physical therapy for improved stability, strength, and decreased fall risk.    Personal Factors and Comorbidities  Fitness    Examination-Activity Limitations  Other   ambulation, reaction time to respond to external stimuli   Examination-Participation Restrictions   Church;Interpersonal Relationship;Yard Work;Community Activity;Shop    Stability/Clinical Decision Making  Stable/Uncomplicated    Rehab Potential  Fair    PT Frequency  1x / week    PT Duration  4 weeks    PT Treatment/Interventions  ADLs/Self Care Home Management;Cryotherapy;Traction;Gait training;Therapeutic exercise;Patient/family education;Manual techniques;Passive range of motion;Spinal Manipulations;Joint Manipulations;Vestibular;Dry needling;Orthotic Fit/Training;Balance training;Stair training;Electrical Stimulation;Ultrasound;Aquatic Therapy;Iontophoresis 4mg /ml Dexamethasone;Functional mobility training;Neuromuscular re-education;Energy  conservation;Splinting;Taping;Wheelchair mobility training;Therapeutic activities;DME Instruction;Moist Heat;Canalith Repostioning    PT Next Visit Plan  compensatory strategies, power/explosive training    PT Home Exercise Plan  medbridge, see pt instruction section (hip abduction sidelying, SLS, tandem, tandem eyes closed)    Consulted and Agree with Plan of Care  Patient       Patient will benefit from skilled therapeutic intervention in order to improve the following deficits and impairments:  Decreased strength, Decreased balance, Other (comment)(compensatory strategies)  Visit Diagnosis: Other abnormalities of gait and mobility  Muscle weakness (generalized)     Problem List Patient Active Problem List   Diagnosis Date Noted  . Cerebral anoxic injury (HCC) 12/18/2014  . CAD in native artery 12/14/2014  . Abnormal thyroid stimulating hormone (TSH) level 11/29/2014  . Absolute anemia 11/29/2014  . Speech disorder 11/29/2014  . Myocardial infarction (HCC) 11/29/2014  . Thrombocytopenia (HCC) 11/29/2014  . Cardiac arrest (HCC) 11/19/2014  . Multinodular goiter 04/03/2014  . HLD (hyperlipidemia) 10/01/2013  . BP (high blood pressure) 10/01/2013   Precious Bard, PT, DPT    12/22/2018, 5:08 PM  [facility] MAIN [facility] SERVICES [address] [place], [place], [address] Phone: [phone]   Fax:  [phone]  Name: Sarah Bowen MRN: [id number] Date of Birth: [birth year]

## 2018-12-24 ENCOUNTER — Ambulatory Visit: Payer: BC Managed Care – PPO

## 2018-12-29 ENCOUNTER — Ambulatory Visit: Payer: BC Managed Care – PPO

## 2018-12-29 ENCOUNTER — Other Ambulatory Visit: Payer: Self-pay

## 2018-12-29 DIAGNOSIS — R2689 Other abnormalities of gait and mobility: Secondary | ICD-10-CM

## 2018-12-29 DIAGNOSIS — M6281 Muscle weakness (generalized): Secondary | ICD-10-CM

## 2018-12-29 NOTE — Therapy (Signed)
[place] MAIN [facility] SERVICES [address] [place], [place], [address] Phone: [phone]   Fax:  [phone]  Physical Therapy Treatment  Patient Details  Name: Sarah Bowen MRN: [id number] Date of Birth: [birth year] Referring Provider (PT): Jennings Books   Encounter Date: 12/29/2018  PT End of Session - 12/29/18 1619    Visit Number  6    Number of Visits  16    Date for PT Re-Evaluation  01/19/19    Authorization Type  eval 11/24/2018 next session 6/10 eval 11/3    PT Start Time  1615    PT Stop Time  1659    PT Time Calculation (min)  44 min    Equipment Utilized During Treatment  Gait belt    Activity Tolerance  Patient tolerated treatment well    Behavior During Therapy  WFL for tasks assessed/performed       Past Medical History:  Diagnosis Date  . Cardiac arrest ([place])   . Hypertension     Past Surgical History:  Procedure Laterality Date  . ABDOMINAL HYSTERECTOMY      There were no vitals filed for this visit.  Subjective Assessment - 12/29/18 1618    Subjective  Patient reports no falls or LOB since last session. Reports her balance is improving    Limitations  Standing;Walking;House hold activities;Lifting    How long can you sit comfortably?  NA    How long can you stand comfortably?  NA    How long can you walk comfortably?  NA    Patient Stated Goals  to decrease falls    Currently in Pain?  No/denies           quantum lvl 5 3 minutes Hallway: CGA  Quick change of direction with PT guidance for left, right, back, forward no LOB 100 ft Head turns with PT guidance for direction, 2 episodes of instability 100 ft  9 cones in a row:  Side step over cones, challenging with LLE, with tendency to circumduct: decreased compensatory mechanisms with cueing and repetition x 4 trials  TRX: Squat with cueing for UE extension in squat position 10x  Curtsy lunge modified 10x each side,  Squat to heel raise 10x    Standing: Single UE support: posterior lunge transition into crane lunge 10x each LE, tactile assistance to LLE for abduction   gastroc and plantar foot stretch on stair 30 seconds x 2 trials each LE  RTB around ankles: forward step backward step four square 10x leading with RLE, 10x leading with LLE, more challenging with coordination of LLE  Stepping strategies: pertubation's anterior, posterior, lateral with feet apart 60 seconds, feet together 60 seconds  Seated: basketball under one LE : sit to stands for modified SLS; cueing for neutral positioning; 10x each LE BTB hip abduction 15x Straight leg abduction 10x RTB dorsiflexion/plantarflexion resistance 20x LLE    Patient demonstrates occasional instability of L knee with adduction of limb with close chained interventions. Patient is demonstrating improved stepping strategies with decreased instability, she continues to be challenged with single limb stability with left leg this session. Patient will benefit from continued skilled physical therapy for improved stability, strength, and decreased fall risk.              PT Education - 12/29/18 1618    Education Details  exercise technique, bodymechanics, stability    Person(s) Educated  Patient    Methods  Explanation;Demonstration;Tactile cues;Verbal cues  Comprehension  Verbalized understanding;Returned demonstration;Verbal cues required;Tactile cues required       PT Short Term Goals - 11/25/18 1512      PT SHORT TERM GOAL #1   Title  The patient will report compliance with initial HEP to demonstrate ability to manage condition at home.    Baseline  administered HEP  11/3    Time  2    Period  Weeks    Status  New    Target Date  12/08/18        PT Long Term Goals - 11/25/18 1513      PT LONG TERM GOAL #1   Title  the patient will report compliance with finalized HEP in preparation for self management of condition    Time  4    Period  Weeks     Status  New    Target Date  12/22/18      PT LONG TERM GOAL #2   Title  The patient will demonstrate 5/5 for bilateral hip strength to improve balance and ability to perform functional activities    Baseline  see eval for details (overall decreased hip abduction/adduction/extension strength bilaterally)    Time  4    Period  Weeks    Target Date  12/22/18      PT LONG TERM GOAL #3   Title  the patient will report performing step over step stair navigation at home to indicate improved ability to perform functional activity.    Time  4    Period  Weeks    Status  New    Target Date  12/22/18      PT LONG TERM GOAL #4   Title  the patient will demonstrate an improved ability to perform compensatory strategies to increase safety at home and decrease risk of falls;    Baseline  minibest: unable to perform stepping strategies/catch herself for forward/backward/lateral.    Time  4    Period  Weeks    Status  New    Target Date  12/22/18      PT LONG TERM GOAL #5   Title  The patient will perform SLS bilaterally >10secs indicating a decreased risk of falls    Baseline  R SLS: 3 seconds, L SLS: 10seconds    Time  4    Period  Weeks    Status  New    Target Date  12/23/18            Plan - 12/29/18 1710    Clinical Impression Statement  Patient demonstrates occasional instability of L knee with adduction of limb with close chained interventions. Patient is demonstrating improved stepping strategies with decreased instability, she continues to be challenged with single limb stability with left leg this session. Patient will benefit from continued skilled physical therapy for improved stability, strength, and decreased fall risk.    Personal Factors and Comorbidities  Fitness    Examination-Activity Limitations  Other   ambulation, reaction time to respond to external stimuli   Examination-Participation Restrictions  Church;Interpersonal Relationship;Yard Work;Community Activity;Shop     Stability/Clinical Decision Making  Stable/Uncomplicated    Rehab Potential  Fair    PT Frequency  1x / week    PT Duration  4 weeks    PT Treatment/Interventions  ADLs/Self Care Home Management;Cryotherapy;Traction;Gait training;Therapeutic exercise;Patient/family education;Manual techniques;Passive range of motion;Spinal Manipulations;Joint Manipulations;Vestibular;Dry needling;Orthotic Fit/Training;Balance training;Stair training;Electrical Stimulation;Ultrasound;Aquatic Therapy;Iontophoresis 4mg /ml Dexamethasone;Functional mobility training;Neuromuscular re-education;Energy conservation;Splinting;Taping;Wheelchair mobility training;Therapeutic activities;DME Instruction;Moist Heat;Canalith Repostioning  PT Next Visit Plan  review goals    PT Home Exercise Plan  medbridge, see pt instruction section (hip abduction sidelying, SLS, tandem, tandem eyes closed)    Consulted and Agree with Plan of Care  Patient       Patient will benefit from skilled therapeutic intervention in order to improve the following deficits and impairments:  Decreased strength, Decreased balance, Other (comment)(compensatory strategies)  Visit Diagnosis: Other abnormalities of gait and mobility  Muscle weakness (generalized)     Problem List Patient Active Problem List   Diagnosis Date Noted  . Cerebral anoxic injury (HCC) 12/18/2014  . CAD in native artery 12/14/2014  . Abnormal thyroid stimulating hormone (TSH) level 11/29/2014  . Absolute anemia 11/29/2014  . Speech disorder 11/29/2014  . Myocardial infarction (HCC) 11/29/2014  . Thrombocytopenia (HCC) 11/29/2014  . Cardiac arrest (HCC) 11/19/2014  . Multinodular goiter 04/03/2014  . HLD (hyperlipidemia) 10/01/2013  . BP (high blood pressure) 10/01/2013   Precious Bard, PT, DPT   12/29/2018, 5:12 PM  [place] [facility] MAIN [facility] SERVICES [address] [place], [place], [address] Phone: [phone]   Fax:   [phone]  Name: Sarah Bowen MRN: [id number] Date of Birth: 02/08/1965

## 2018-12-31 ENCOUNTER — Ambulatory Visit: Payer: BC Managed Care – PPO

## 2019-01-05 ENCOUNTER — Ambulatory Visit: Payer: BC Managed Care – PPO

## 2019-01-07 ENCOUNTER — Ambulatory Visit: Payer: BC Managed Care – PPO

## 2019-01-12 ENCOUNTER — Ambulatory Visit: Payer: BC Managed Care – PPO

## 2019-01-19 ENCOUNTER — Ambulatory Visit: Payer: BC Managed Care – PPO

## 2019-01-21 ENCOUNTER — Ambulatory Visit: Payer: BC Managed Care – PPO

## 2021-07-04 IMAGING — MR MR CERVICAL SPINE WO/W CM
5 of 8 series · 29 of 48 positions shown · IV contrast (gadavist)
Comparison: None.

CLINICAL DATA: Hyperreflexia

EXAM:
MRI CERVICAL SPINE WITHOUT AND WITH CONTRAST
TECHNIQUE: Multiplanar and multiecho pulse sequences of the cervical spine, to
include the craniocervical junction and cervicothoracic junction,
were obtained without and with intravenous contrast.
CONTRAST:  7mL GADAVIST GADOBUTROL 1 MMOL/ML IV SOLN

[Series 5: T2 · sagittal · 3.0mm · 0.62mm/px · 4 of 15 slices shown (1 of 2)]
[im 1/15]
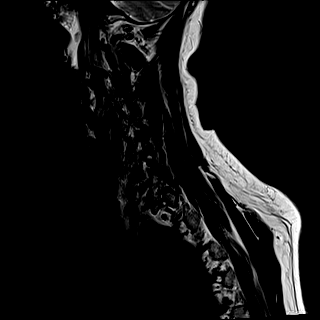
[im 5/15]
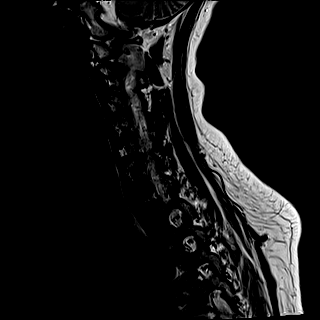
[im 10/15]
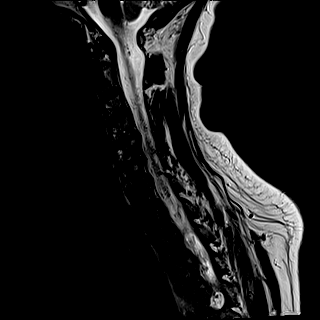
[im 15/15]
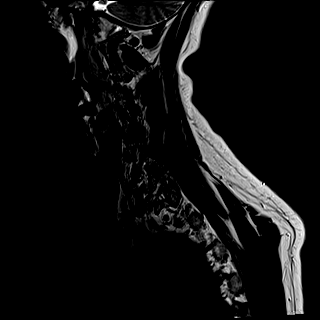

[Series 7: STIR · sagittal · 3.0mm · 0.62mm/px · 4 of 15 slices shown]
[im 1/15]
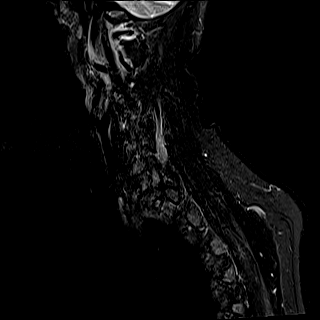
[im 5/15]
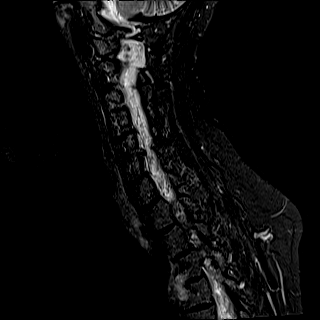
[im 10/15]
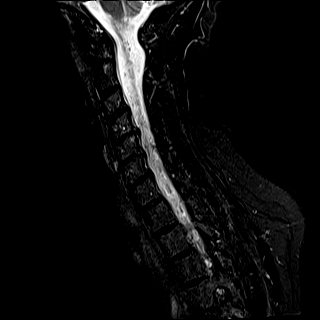
[im 15/15]
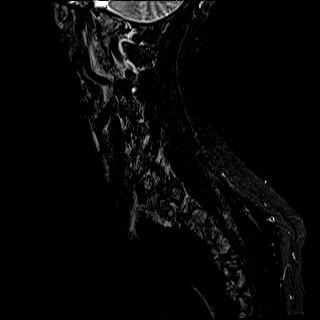

[Series 8: T2 · axial · 3.0mm · 0.70mm/px · z∈[-126,-31]mm · 8 of 29 slices shown (2 of 2)]
[im 1/29]
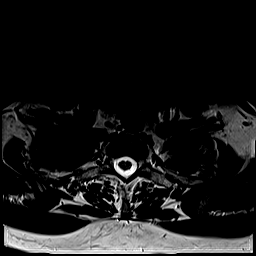
[im 5/29]
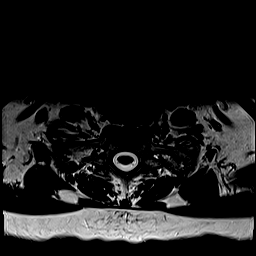
[im 9/29]
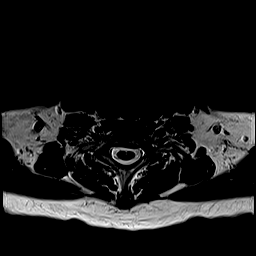
[im 13/29]
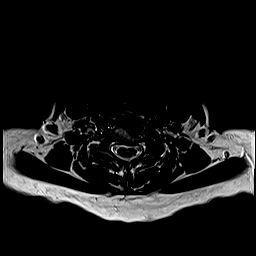
[im 17/29]
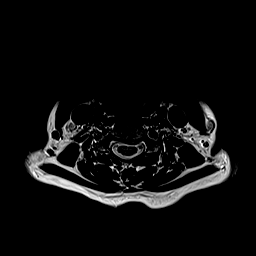
[im 21/29]
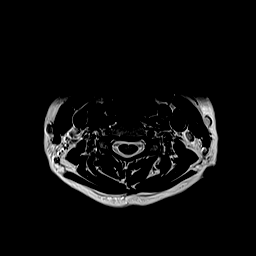
[im 25/29]
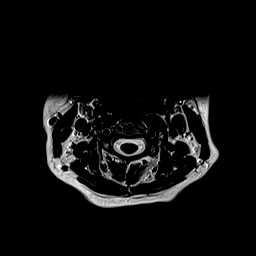
[im 29/29]
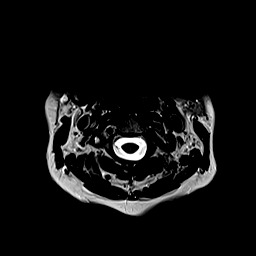

[Series 10: T1 · axial · non-contrast · 3.0mm · 0.35mm/px · z∈[-126,-31]mm · 8 of 29 slices shown]
[im 1/29]
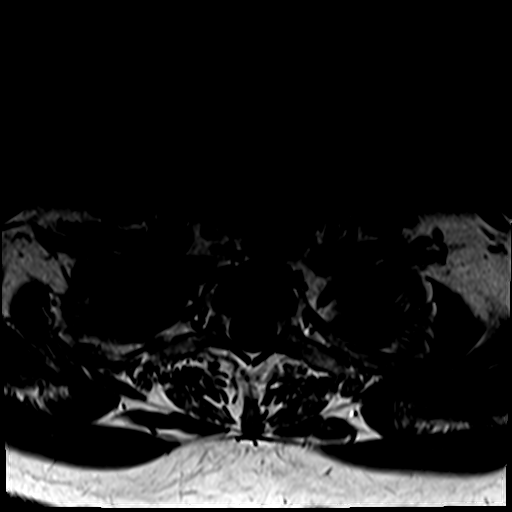
[im 5/29]
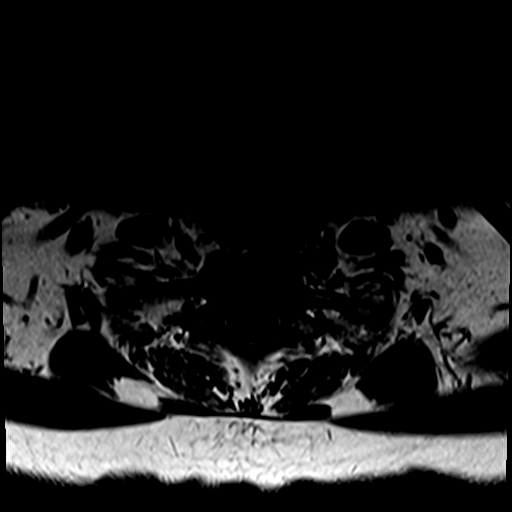
[im 9/29]
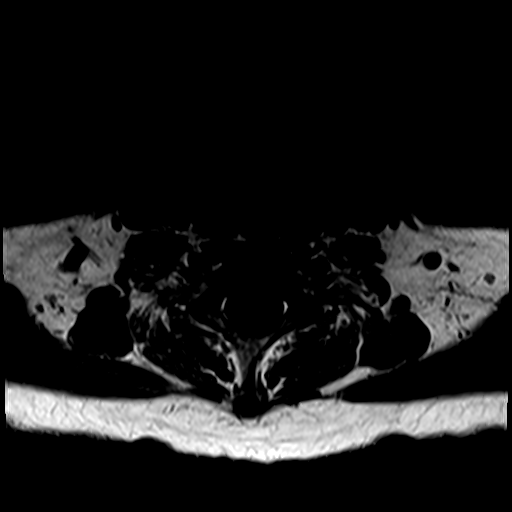
[im 13/29]
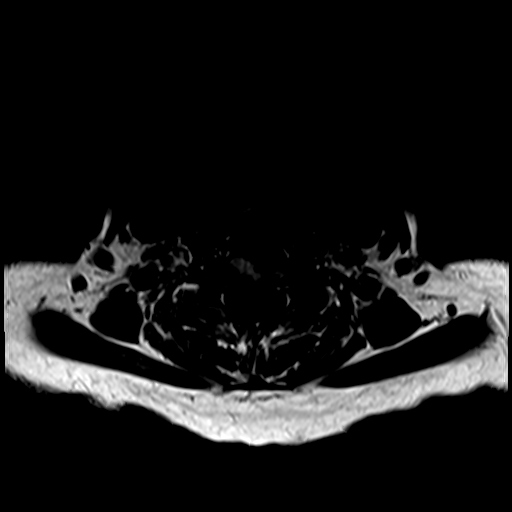
[im 17/29]
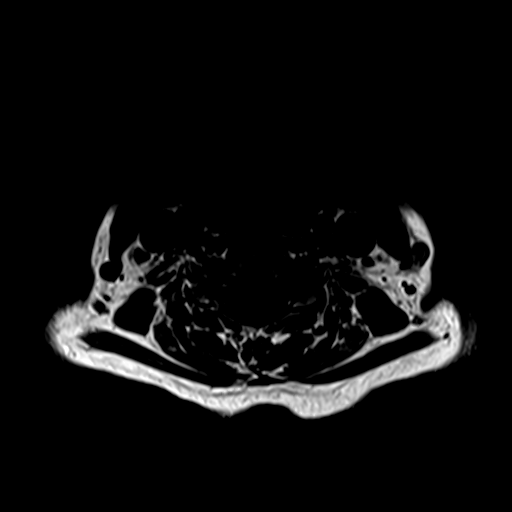
[im 21/29]
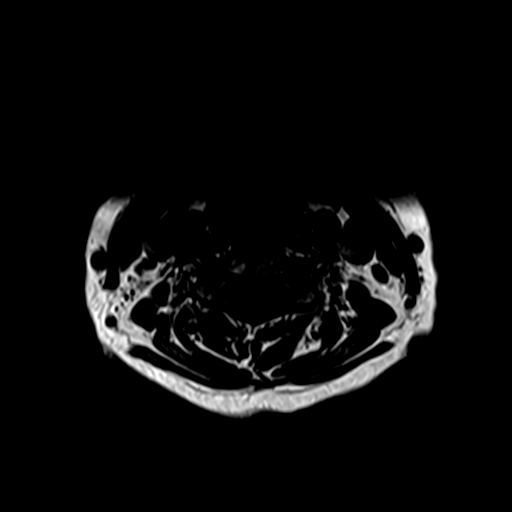
[im 25/29]
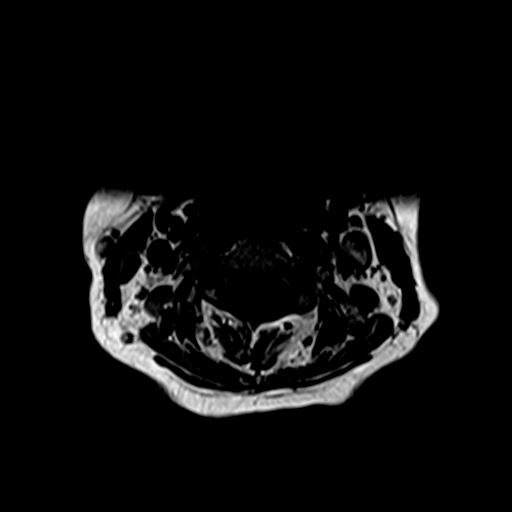
[im 29/29]
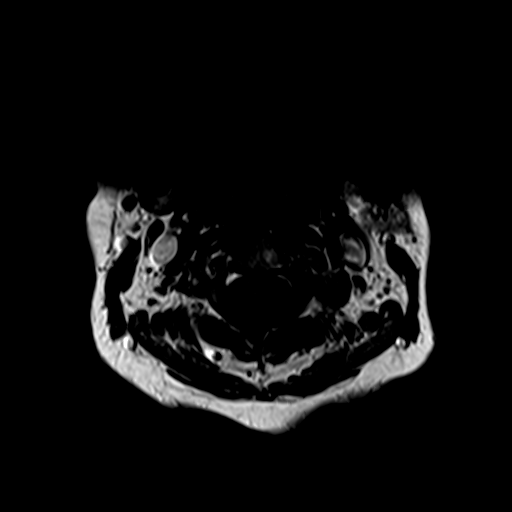

[Series 12: T1 post-contrast · axial · 3.0mm · 0.35mm/px · z∈[-126,-71]mm · 5 of 29 slices shown]
[im 1/29]
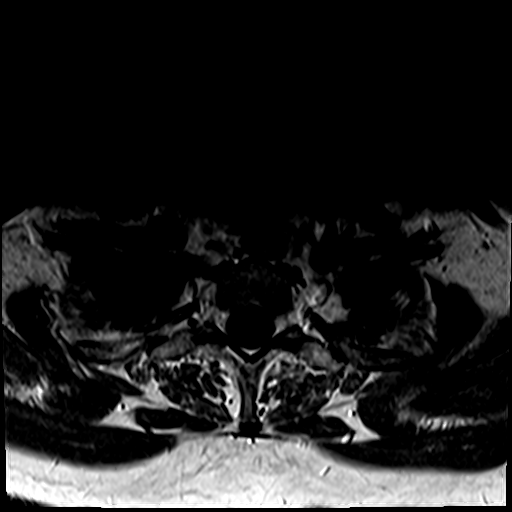
[im 5/29]
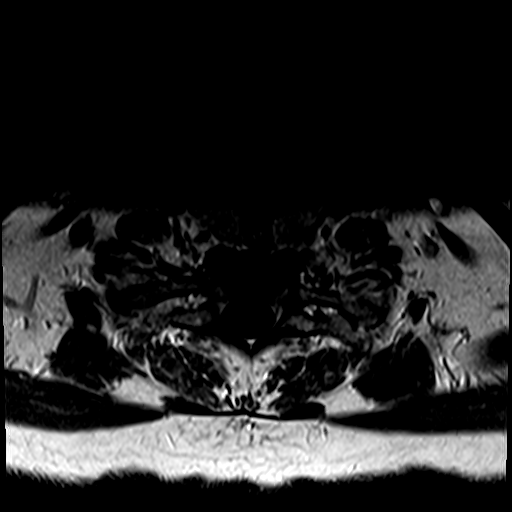
[im 9/29]
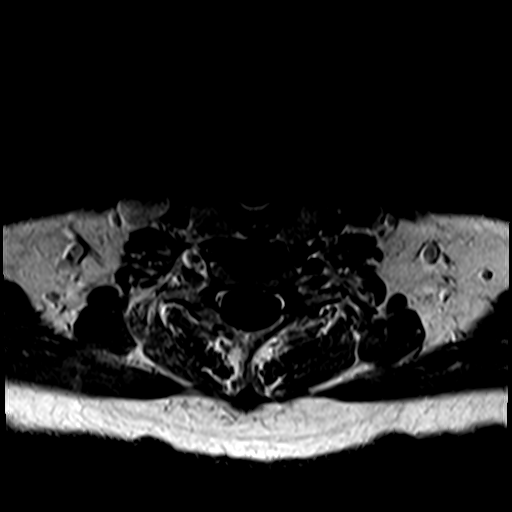
[im 13/29]
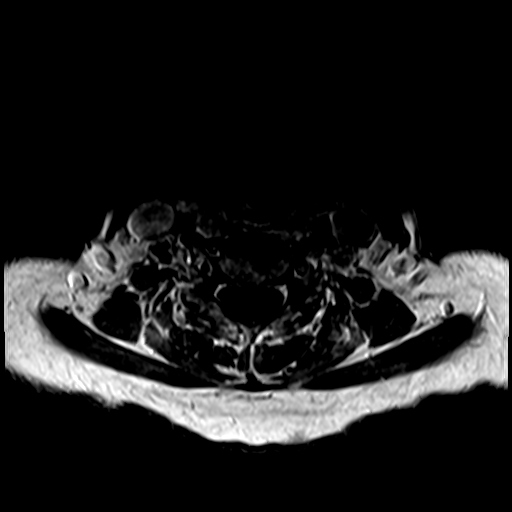
[im 17/29]
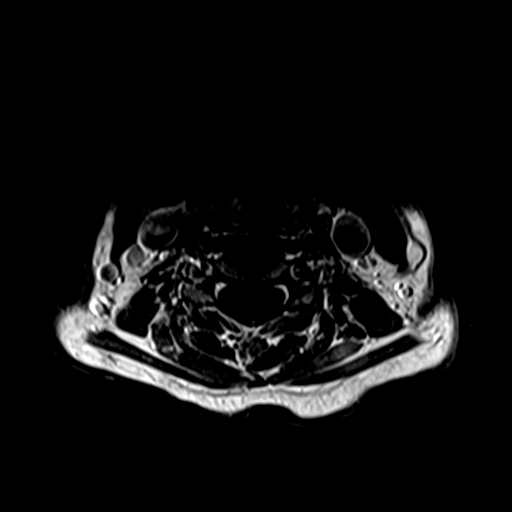

[29 of 48 positions shown; findings below may reference images not displayed]

FINDINGS: Alignment: Normal alignment. Straightening of the cervical lordosis.

Vertebrae: Negative for fracture or mass. No bone marrow edema or
abnormal enhancement.

Cord: Normal signal and morphology.  Normal cord enhancement.

Posterior Fossa, vertebral arteries, paraspinal tissues: Negative

Disc levels:

C2-3: Negative

C3-4: Mild disc degeneration.  Negative for stenosis

C4-5: Disc degeneration and mild spurring. Mild left foraminal
narrowing due to spurring.

C5-6: Disc degeneration and diffuse uncinate spurring. Cord
flattening with mild spinal stenosis and moderate foraminal
encroachment bilaterally.

C6-7: Disc degeneration and mild spurring. Mild left foraminal
narrowing

C7-T1: Negative
IMPRESSION: Disc degeneration and spondylosis as above.  No acute abnormality.

Mild spinal stenosis and moderate foraminal encroachment bilaterally
at C5-6.

## 2024-01-17 ENCOUNTER — Other Ambulatory Visit: Payer: Self-pay

## 2024-01-17 ENCOUNTER — Emergency Department
Admission: EM | Admit: 2024-01-17 | Discharge: 2024-01-17 | Disposition: A | Discharge status: Home / Self Care | Attending: Emergency Medicine | Admitting: Emergency Medicine

## 2024-01-17 DIAGNOSIS — T7840XA Allergy, unspecified, initial encounter: Secondary | ICD-10-CM | POA: Insufficient documentation

## 2024-01-17 DIAGNOSIS — I10 Essential (primary) hypertension: Secondary | ICD-10-CM | POA: Diagnosis not present

## 2024-01-17 DIAGNOSIS — I2510 Atherosclerotic heart disease of native coronary artery without angina pectoris: Secondary | ICD-10-CM | POA: Diagnosis not present

## 2024-01-17 DIAGNOSIS — R220 Localized swelling, mass and lump, head: Secondary | ICD-10-CM | POA: Diagnosis present

## 2024-01-17 HISTORY — DX: Atherosclerotic heart disease of native coronary artery without angina pectoris: I25.10

## 2024-01-17 LAB — CBC WITH DIFFERENTIAL/PLATELET
Abs Immature Granulocytes: 0.02 K/uL (ref 0.00–0.07)
Basophils Absolute: 0 K/uL (ref 0.0–0.1)
Basophils Relative: 0 %
Eosinophils Absolute: 0 K/uL (ref 0.0–0.5)
Eosinophils Relative: 0 %
HCT: 37.3 % (ref 36.0–46.0)
Hemoglobin: 12.8 g/dL (ref 12.0–15.0)
Immature Granulocytes: 0 %
Lymphocytes Relative: 27 %
Lymphs Abs: 2.4 K/uL (ref 0.7–4.0)
MCH: 30.7 pg (ref 26.0–34.0)
MCHC: 34.3 g/dL (ref 30.0–36.0)
MCV: 89.4 fL (ref 80.0–100.0)
Monocytes Absolute: 0.7 K/uL (ref 0.1–1.0)
Monocytes Relative: 7 %
Neutro Abs: 5.9 K/uL (ref 1.7–7.7)
Neutrophils Relative %: 66 %
Platelets: 198 K/uL (ref 150–400)
RBC: 4.17 MIL/uL (ref 3.87–5.11)
RDW: 12.7 % (ref 11.5–15.5)
WBC: 9 K/uL (ref 4.0–10.5)
nRBC: 0 % (ref 0.0–0.2)

## 2024-01-17 LAB — BASIC METABOLIC PANEL WITH GFR
Anion gap: 14 (ref 5–15)
BUN: 16 mg/dL (ref 6–20)
CO2: 24 mmol/L (ref 22–32)
Calcium: 9.5 mg/dL (ref 8.9–10.3)
Chloride: 100 mmol/L (ref 98–111)
Creatinine, Ser: 1.29 mg/dL — ABNORMAL HIGH (ref 0.44–1.00)
GFR, Estimated: 48 mL/min — ABNORMAL LOW
Glucose, Bld: 113 mg/dL — ABNORMAL HIGH (ref 70–99)
Potassium: 3.8 mmol/L (ref 3.5–5.1)
Sodium: 138 mmol/L (ref 135–145)

## 2024-01-17 MED ORDER — LORATADINE 10 MG PO TABS: 10.0000 mg | ORAL_TABLET | Freq: Every day | ORAL | 2 refills | Status: AC

## 2024-01-17 NOTE — ED Notes (Signed)
 AAOx3. Skin warm and dry.  +1 swelling to face across upper nose.  Voice clear and strong. NAD

## 2024-01-17 NOTE — ED Triage Notes (Signed)
 Pt to ED for swelling under both eyes (looks like fluid under skin) and to bridge of nose since 12/25. Telehealth doc said it may be cellulitis. Also R ear is ringing and feels blocked since same time. Also both eyes are itchy.

## 2024-01-17 NOTE — ED Provider Notes (Signed)
 "  [facility] Provider Note    Event Date/Time   First MD Initiated Contact with Patient 01/17/24 1330     (approximate)   History   Possible cellulitis   HPI  Sarah Bowen is a 58 y.o. female with a history of CAD, hypertension who presents with swelling underneath her eyes which has been present for couple of days, she describes watery eyes and itchy eyes as well.  Some nasal congestion  Saw virtual provider who suggested possible facial cellulitis     Physical Exam   Triage Vital Signs: ED Triage Vitals  Encounter Vitals Group     BP 01/17/24 1205 (!) 143/83     Girls Systolic BP Percentile --      Girls Diastolic BP Percentile --      Boys Systolic BP Percentile --      Boys Diastolic BP Percentile --      Pulse Rate 01/17/24 1205 80     Resp 01/17/24 1205 19     Temp 01/17/24 1205 98.6 F (37 C)     Temp src --      SpO2 01/17/24 1205 98 %     Weight 01/17/24 1209 74.8 kg (165 lb)     Height 01/17/24 1209 1.6 m (5' 3)     Head Circumference --      Peak Flow --      Pain Score 01/17/24 1208 0     Pain Loc --      Pain Education --      Exclude from Growth Chart --     Most recent vital signs: Vitals:   01/17/24 1205  BP: (!) 143/83  Pulse: 80  Resp: 19  Temp: 98.6 F (37 C)  SpO2: 98%     General: Awake, no distress.  CV:  Good peripheral perfusion.  Resp:  Normal effort.  Abd:  No distention.  Other:  Mild edema underneath the eyes bilaterally, no erythema to suggest infection.  Suggestive of allergic reaction   ED Results / Procedures / Treatments   Labs (all labs ordered are listed, but only abnormal results are displayed) Labs Reviewed  BASIC METABOLIC PANEL WITH GFR - Abnormal; Notable for the following components:      Result Value   Glucose, Bld 113 (*)    Creatinine, Ser 1.29 (*)    GFR, Estimated 48 (*)    All other components within normal limits  CBC WITH DIFFERENTIAL/PLATELET      EKG     RADIOLOGY     PROCEDURES:  Critical Care performed:   Procedures   MEDICATIONS ORDERED IN ED: Medications - No data to display   IMPRESSION / MDM / ASSESSMENT AND PLAN / ED COURSE  I reviewed the triage vital signs and the nursing notes. Patient's presentation is most consistent with acute illness / injury with system symptoms.  Patient presents with swelling underneath the eyes, exam is overall quite reassuring small amount of fluid underneath the eyes bilaterally without erythema most consistent with likely allergic reaction probably environmental.  No hives  Will trial course of Claritin  and appropriate for outpatient follow-up with PCP.        FINAL CLINICAL IMPRESSION(S) / ED DIAGNOSES   Final diagnoses:  Allergic reaction, initial encounter     Rx / DC Orders   ED Discharge Orders          Ordered    loratadine  (CLARITIN ) 10 MG tablet  Daily  01/17/24 1347             Note:  This document was prepared using Dragon voice recognition software and may include unintentional dictation errors.   Arlander Charleston, MD 01/17/24 1524  "
